# Patient Record
Sex: Male | Born: 1940 | Race: White | Hispanic: No | Smoking: Former smoker
Health system: Southern US, Community
[De-identification: ages and names within clinical notes are randomized; demographics above are authoritative.]

## PROBLEM LIST (undated history)

## (undated) DIAGNOSIS — I639 Cerebral infarction, unspecified: Secondary | ICD-10-CM

## (undated) DIAGNOSIS — I2510 Atherosclerotic heart disease of native coronary artery without angina pectoris: Secondary | ICD-10-CM

## (undated) DIAGNOSIS — G40909 Epilepsy, unspecified, not intractable, without status epilepticus: Secondary | ICD-10-CM

## (undated) DIAGNOSIS — C189 Malignant neoplasm of colon, unspecified: Secondary | ICD-10-CM

## (undated) DIAGNOSIS — K219 Gastro-esophageal reflux disease without esophagitis: Secondary | ICD-10-CM

## (undated) DIAGNOSIS — G8929 Other chronic pain: Secondary | ICD-10-CM

## (undated) HISTORY — DX: Cerebral infarction, unspecified: I63.9

## (undated) HISTORY — PX: COLON SURGERY: SHX602

## (undated) HISTORY — PX: CORONARY ARTERY BYPASS GRAFT: SHX141

---

## 2000-06-15 DIAGNOSIS — C189 Malignant neoplasm of colon, unspecified: Secondary | ICD-10-CM

## 2000-06-15 HISTORY — DX: Malignant neoplasm of colon, unspecified: C18.9

## 2013-08-14 ENCOUNTER — Telehealth: Payer: Self-pay | Admitting: Family Medicine

## 2013-08-14 NOTE — Telephone Encounter (Signed)
Appt given with bill

## 2013-08-21 ENCOUNTER — Ambulatory Visit (INDEPENDENT_AMBULATORY_CARE_PROVIDER_SITE_OTHER): Payer: Medicare Other | Admitting: Family Medicine

## 2013-08-21 ENCOUNTER — Encounter: Payer: Self-pay | Admitting: Family Medicine

## 2013-08-21 ENCOUNTER — Encounter (INDEPENDENT_AMBULATORY_CARE_PROVIDER_SITE_OTHER): Payer: Self-pay

## 2013-08-21 VITALS — BP 134/74 | HR 100 | Temp 98.2°F | Ht 72.0 in | Wt 165.2 lb

## 2013-08-21 DIAGNOSIS — D689 Coagulation defect, unspecified: Secondary | ICD-10-CM

## 2013-08-21 DIAGNOSIS — Z9889 Other specified postprocedural states: Secondary | ICD-10-CM

## 2013-08-21 DIAGNOSIS — R634 Abnormal weight loss: Secondary | ICD-10-CM

## 2013-08-21 DIAGNOSIS — M5136 Other intervertebral disc degeneration, lumbar region: Secondary | ICD-10-CM | POA: Insufficient documentation

## 2013-08-21 DIAGNOSIS — N390 Urinary tract infection, site not specified: Secondary | ICD-10-CM | POA: Insufficient documentation

## 2013-08-21 DIAGNOSIS — C679 Malignant neoplasm of bladder, unspecified: Secondary | ICD-10-CM | POA: Insufficient documentation

## 2013-08-21 DIAGNOSIS — I639 Cerebral infarction, unspecified: Secondary | ICD-10-CM | POA: Insufficient documentation

## 2013-08-21 DIAGNOSIS — T45515A Adverse effect of anticoagulants, initial encounter: Secondary | ICD-10-CM

## 2013-08-21 DIAGNOSIS — I25810 Atherosclerosis of coronary artery bypass graft(s) without angina pectoris: Secondary | ICD-10-CM | POA: Insufficient documentation

## 2013-08-21 DIAGNOSIS — R58 Hemorrhage, not elsewhere classified: Secondary | ICD-10-CM | POA: Insufficient documentation

## 2013-08-21 DIAGNOSIS — Z433 Encounter for attention to colostomy: Secondary | ICD-10-CM | POA: Insufficient documentation

## 2013-08-21 DIAGNOSIS — C189 Malignant neoplasm of colon, unspecified: Secondary | ICD-10-CM | POA: Insufficient documentation

## 2013-08-21 DIAGNOSIS — G894 Chronic pain syndrome: Secondary | ICD-10-CM | POA: Insufficient documentation

## 2013-08-21 DIAGNOSIS — K219 Gastro-esophageal reflux disease without esophagitis: Secondary | ICD-10-CM | POA: Insufficient documentation

## 2013-08-21 DIAGNOSIS — I6350 Cerebral infarction due to unspecified occlusion or stenosis of unspecified cerebral artery: Secondary | ICD-10-CM

## 2013-08-21 DIAGNOSIS — M5137 Other intervertebral disc degeneration, lumbosacral region: Secondary | ICD-10-CM

## 2013-08-21 DIAGNOSIS — G40909 Epilepsy, unspecified, not intractable, without status epilepticus: Secondary | ICD-10-CM | POA: Insufficient documentation

## 2013-08-21 DIAGNOSIS — E559 Vitamin D deficiency, unspecified: Secondary | ICD-10-CM

## 2013-08-21 DIAGNOSIS — I10 Essential (primary) hypertension: Secondary | ICD-10-CM | POA: Insufficient documentation

## 2013-08-21 LAB — POCT UA - MICROSCOPIC ONLY
Casts, Ur, LPF, POC: NEGATIVE
Crystals, Ur, HPF, POC: NEGATIVE
Yeast, UA: NEGATIVE

## 2013-08-21 LAB — POCT CBC
Granulocyte percent: 78.4 %G (ref 37–80)
HCT, POC: 37 % — AB (ref 43.5–53.7)
Hemoglobin: 12.2 g/dL — AB (ref 14.1–18.1)
Lymph, poc: 1.3 (ref 0.6–3.4)
MCH, POC: 28.7 pg (ref 27–31.2)
MCHC: 33 g/dL (ref 31.8–35.4)
MCV: 86.7 fL (ref 80–97)
MPV: 9.7 fL (ref 0–99.8)
POC Granulocyte: 6.4 (ref 2–6.9)
POC LYMPH PERCENT: 15.5 %L (ref 10–50)
Platelet Count, POC: 343 10*3/uL (ref 142–424)
RBC: 4.3 M/uL — AB (ref 4.69–6.13)
RDW, POC: 16.1 %
WBC: 8.2 10*3/uL (ref 4.6–10.2)

## 2013-08-21 LAB — POCT URINALYSIS DIPSTICK
Bilirubin, UA: NEGATIVE
Glucose, UA: NEGATIVE
Ketones, UA: NEGATIVE
Nitrite, UA: NEGATIVE
Spec Grav, UA: 1.02
Urobilinogen, UA: NEGATIVE
pH, UA: 6.5

## 2013-08-21 LAB — POCT INR: INR: 2

## 2013-08-21 MED ORDER — LEVETIRACETAM 500 MG PO TABS: 500.0000 mg | ORAL_TABLET | Freq: Two times a day (BID) | ORAL | Status: DC

## 2013-08-21 MED ORDER — PRAVASTATIN SODIUM 40 MG PO TABS: 40.0000 mg | ORAL_TABLET | Freq: Every day | ORAL | Status: AC

## 2013-08-21 MED ORDER — SERTRALINE HCL 50 MG PO TABS: 50.0000 mg | ORAL_TABLET | Freq: Every day | ORAL | Status: DC

## 2013-08-21 MED ORDER — CARVEDILOL 3.125 MG PO TABS: 3.1250 mg | ORAL_TABLET | Freq: Two times a day (BID) | ORAL | Status: DC

## 2013-08-21 MED ORDER — NITROFURANTOIN MONOHYD MACRO 100 MG PO CAPS: 100.0000 mg | ORAL_CAPSULE | Freq: Two times a day (BID) | ORAL | Status: DC

## 2013-08-21 MED ORDER — DILTIAZEM HCL ER BEADS 300 MG PO CP24: 300.0000 mg | ORAL_CAPSULE | Freq: Every day | ORAL | Status: DC

## 2013-08-21 MED ORDER — MEGESTROL ACETATE 400 MG/10ML PO SUSP: 800.0000 mg | Freq: Every day | ORAL | Status: DC

## 2013-08-21 MED ORDER — PANTOPRAZOLE SODIUM 40 MG PO TBEC: 40.0000 mg | DELAYED_RELEASE_TABLET | Freq: Two times a day (BID) | ORAL | Status: AC

## 2013-08-21 MED ORDER — LISINOPRIL 2.5 MG PO TABS: 2.5000 mg | ORAL_TABLET | Freq: Every day | ORAL | Status: DC

## 2013-08-21 MED ORDER — OXYCODONE HCL ER 30 MG PO T12A: 1.0000 | EXTENDED_RELEASE_TABLET | Freq: Two times a day (BID) | ORAL | Status: DC

## 2013-08-21 MED ORDER — WARFARIN SODIUM 2 MG PO TABS: 2.0000 mg | ORAL_TABLET | Freq: Every day | ORAL | Status: DC

## 2013-08-21 NOTE — Progress Notes (Signed)
Subjective:    Patient ID: [name] [name], male    DOB: 1940-07-10, 73 y.o.   MRN: [id number]  HPI This 73 y.o. male presents for evaluation of establishment visit.  He has hx of CVA and he has Been on warfarin.  His INR was 10.6 and he was hospitalized at [facility].  His daughter in [place] is present and gives hx.  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  He was seen In [place] for CVA this year and then was transferred to [place] rehab in [facility] And was [facility] 07/28/13 and has been staying with his son and daughter in law.  He has hx of colon Cancer 2001 and had bowel resectiolostomy.  In 2002 he was found to have metastatic colon cancer involving the bladder and had another resection of the colon along cystectomy and placement of urostomy tubes and colostomy.  This was in [place].  He was living at home with his wife and he seperated and now is living with his sone and daughter in law.  He is feeling sick and he is losing weight.  He had sz's when he had his CVA 06/24/13.    Review of Systems No chest pain, SOB, HA, dizziness, vision change, N/V, diarrhea, constipation, dysuria, urinary urgency or frequency, myalgias, arthralgias or rash.     Objective:   Physical Exam Vital signs noted  Well developed well nourished male.  HEENT - Head atraumatic Normocephalic                Eyes - PERRLA, Conjuctiva - clear Sclera- Clear EOMI                Ears - EAC's Wnl TM's Wnl Gross Hearing WNL                Nose - Nares patent                 Throat - oropharanx wnl Respiratory - Lungs CTA bilateral Cardiac - RRR S1 and S2 without murmur GI - Abdomen soft Nontender and bowel sounds active x 4 Extremities - No edema. Neuro - Grossly intact.  Results for orders placed in visit on 08/21/13  POCT CBC      Result Value Ref Range   WBC 8.2  4.6 - 10.2 K/uL   Lymph, poc 1.3  0.6 - 3.4   POC LYMPH PERCENT 15.5  10 - 50 %L   POC Granulocyte 6.4  2 - 6.9   Granulocyte percent 78.4  37 - 80 %G   RBC 4.3 (*) 4.69 - 6.13 M/uL   Hemoglobin 12.2 (*) 14.1 - 18.1 g/dL   HCT, POC 37.0 (*) 43.5 - 53.7 %   MCV 86.7  80 - 97 fL   MCH, POC 28.7  27 - 31.2 pg   MCHC 33.0  31.8 - 35.4 g/dL   RDW, POC 16.1     Platelet Count, POC 343.0  142 - 424 K/uL   MPV 9.7  0 - 99.8 fL  POCT INR      Result Value Ref Range   INR 2.0    POCT URINALYSIS DIPSTICK      Result Value Ref Range   Color, UA GOLD     Clarity, UA CLOUDY     Glucose, UA NEG     Bilirubin, UA NEG     Ketones, UA NEG     Spec Grav, UA 1.020  Blood, UA LARGE     pH, UA 6.5     Protein, UA 3+     Urobilinogen, UA negative     Nitrite, UA NEG     Leukocytes, UA large (3+)    POCT UA - MICROSCOPIC ONLY      Result Value Ref Range   WBC, Ur, HPF, POC TNTC     RBC, urine, microscopic TNTC     Bacteria, U Microscopic MOD     Mucus, UA FEW     Epithelial cells, urine per micros FEW     Crystals, Ur, HPF, POC NEG     Casts, Ur, LPF, POC NEG     Yeast, UA NEG         Assessment & Plan:  DDD (degenerative disc disease), lumbar - Plan: Ambulatory referral to Pain Clinic  Essential hypertension, benign - Controlled and continue current antihypertensive meds  Colon cancer - Plan: Ambulatory referral to [place], Ambulatory referral to Pain Clinic  GERD (gastroesophageal reflux disease) - Plan: Continue protonix  Bladder cancer - Plan: Ambulatory referral to Oncology, Ambulatory referral to [place], Ambulatory referral to Pain Clinic  CVA (cerebral vascular accident) - Plan: POCT INR, Ambulatory referral to Neurology, Ambulatory referral to [place], Ambulatory referral to Urology  Bleeding on Coumadin - Plan: POCT CBC, CMP14+EGFR, Ambulatory referral to Neurology, Ambulatory referral to Home Health  Seizure disorder - Continue Keppra and refer to Neurology  CAD (coronary artery disease) of artery bypass graft  - will need referral to Cards in future.  Chronic pain  syndrome - Oxycodone ER 12 hours one po q 12 hours #60 and will refer to pain managemetn.  UTI (lower urinary tract infection) - Plan: POCT urinalysis dipstick, POCT UA - Microscopic Only UA cx ordered and macrobid 154m one po bid x 2 weeks #28  Colostomy care - Home Health Referral  History of urostomy - Plan: Ambulatory referral to Urology  Unspecified vitamin D deficiency - Plan: Vit D  25 hydroxy (rtn osteoporosis monitoring)  Spent over 60 minutes in visit with patient.  Follow up in 1 month  WLysbeth PennerFNP

## 2013-08-21 NOTE — Patient Instructions (Signed)
Anticoagulation Dose Instructions as of 08/21/2013     [name] [name] Tue Wed Thu Fri Sat   New Dose 2 mg 2 mg 2 mg 2 mg 2 mg 2 mg 2 mg    Description       Continue current and follow up in one month for repeat INR

## 2013-08-22 ENCOUNTER — Other Ambulatory Visit: Payer: Self-pay | Admitting: Family Medicine

## 2013-08-22 ENCOUNTER — Encounter: Payer: Self-pay | Admitting: Neurology

## 2013-08-22 ENCOUNTER — Telehealth: Payer: Self-pay | Admitting: Family Medicine

## 2013-08-22 ENCOUNTER — Telehealth: Payer: Self-pay | Admitting: *Deleted

## 2013-08-22 ENCOUNTER — Ambulatory Visit (INDEPENDENT_AMBULATORY_CARE_PROVIDER_SITE_OTHER): Payer: Medicare Other | Admitting: Neurology

## 2013-08-22 VITALS — BP 140/88 | HR 80 | Temp 98.0°F | Resp 18 | Ht 72.0 in | Wt 167.8 lb

## 2013-08-22 DIAGNOSIS — I6350 Cerebral infarction due to unspecified occlusion or stenosis of unspecified cerebral artery: Secondary | ICD-10-CM

## 2013-08-22 DIAGNOSIS — R569 Unspecified convulsions: Secondary | ICD-10-CM

## 2013-08-22 DIAGNOSIS — I639 Cerebral infarction, unspecified: Secondary | ICD-10-CM

## 2013-08-22 DIAGNOSIS — E875 Hyperkalemia: Secondary | ICD-10-CM

## 2013-08-22 LAB — CMP14+EGFR
ALT: 11 IU/L (ref 0–44)
AST: 22 IU/L (ref 0–40)
Albumin/Globulin Ratio: 1.3 (ref 1.1–2.5)
Albumin: 4.1 g/dL (ref 3.5–4.8)
Alkaline Phosphatase: 62 IU/L (ref 39–117)
BUN/Creatinine Ratio: 9 — ABNORMAL LOW (ref 10–22)
BUN: 22 mg/dL (ref 8–27)
CO2: 23 mmol/L (ref 18–29)
Calcium: 9.6 mg/dL (ref 8.6–10.2)
Chloride: 99 mmol/L (ref 97–108)
Creatinine, Ser: 2.34 mg/dL — ABNORMAL HIGH (ref 0.76–1.27)
GFR calc Af Amer: 31 mL/min/{1.73_m2} — ABNORMAL LOW (ref >59)
GFR calc non Af Amer: 27 mL/min/{1.73_m2} — ABNORMAL LOW (ref >59)
Globulin, Total: 3.1 g/dL (ref 1.5–4.5)
Glucose: 97 mg/dL (ref 65–99)
Potassium: 6 mmol/L (ref 3.5–5.2)
Sodium: 139 mmol/L (ref 134–144)
Total Bilirubin: 0.3 mg/dL (ref 0.0–1.2)
Total Protein: 7.2 g/dL (ref 6.0–8.5)

## 2013-08-22 LAB — VITAMIN D 25 HYDROXY (VIT D DEFICIENCY, FRACTURES): Vit D, 25-Hydroxy: 13.2 ng/mL — ABNORMAL LOW (ref 30.0–100.0)

## 2013-08-22 LAB — VITAMIN B12: Vitamin B-12: 284 pg/mL (ref 211–946)

## 2013-08-22 MED ORDER — CYANOCOBALAMIN 1000 MCG/ML IJ SOLN: INTRAMUSCULAR | Status: DC

## 2013-08-22 MED ORDER — VITAMIN D (ERGOCALCIFEROL) 1.25 MG (50000 UNIT) PO CAPS: 50000.0000 [IU] | ORAL_CAPSULE | ORAL | Status: DC

## 2013-08-22 NOTE — Telephone Encounter (Signed)
agree

## 2013-08-22 NOTE — Patient Instructions (Signed)
1.  Continue the Coumadin and Keppra for now. 2.  We will need to get the notes from [place] from both stroke admissions to see why [name] [name] is on coumadin and what tests were done. 3.  No driving. 4.  Follow up in 3 months.

## 2013-08-22 NOTE — Progress Notes (Addendum)
NEUROLOGY CONSULTATION NOTE  [name] [name] MRN: [id number] DOB: March 19, 1941  Referring provider: Stevan Born, FNP Primary care provider: Stevan Born, FNP  Reason for consult:  CVA  HISTORY OF PRESENT ILLNESS: [name] [name] is a 73 year old right-handed man with history of CVA, hypertension, hyperlipidemia, CAD status post bypass graft, bladder cancer, colon cancer who presents for evaluation of CVA.  He is accompanied by his daughter-in-law.  Records and images were personally reviewed where available.    He reportedly had a stroke in January 2014, which left him with residual left-sided weakness.  At that time, he was started on warfarin, but the patient and his daughter in law do not know why.  On 06/25/13, he had a stroke and was admitted to [place] [facility].  He reportedly had a seizure as well, and was placed on Keppra.  Semiology of the seizure and stroke is unknown to patient and daughter-in-law, as well as location of stroke.  He reportedly had a workup and was discharged to rehab until 07/28/13.  He was readmitted to the hospital on 08/13/13 for elevated INR and dehydration.  There was blood in the colostomy and urostomy bag.  His INR was reportedly 10.6.  His warfarin was held and restarted from 4mg  to 2mg .  He reports residual blurred vision.  He has not had any further seizures.  He has been feeling depressed because of his recent health problems.  Also, he is recently separated from his wife and moved in with his son and daughter-in-law.  Unfortunately, both the patient and daughter-in-law are poor historians and I do not have the records from [facility] [employer].  Past medical history is significant for colon cancer diagnosed in 2001 with subsequent bowel resectiolostomy.  In 2002, he underwent another colon resection and cystectomy placement of urostomy tubes and colostomy after it was discovered he had metastatic colon cancer involving the bladder.  INR checked 08/21/13  was 2.0.  PAST MEDICAL HISTORY: Past Medical History  Diagnosis Date  . Cancer 2002    colon  . Stroke     PAST SURGICAL HISTORY: Past Surgical History  Procedure Laterality Date  . Colon surgery    . Coronary artery bypass graft      MEDICATIONS: Current Outpatient Prescriptions on File Prior to Visit  Medication Sig Dispense Refill  . carvedilol (COREG) 3.125 MG tablet Take 1 tablet (3.125 mg total) by mouth 2 (two) times daily with a meal.  60 tablet  11  . cholecalciferol (VITAMIN D) 1000 UNITS tablet Take 1,000 Units by mouth daily.      Marland Kitchen diltiazem (TIAZAC) 300 MG 24 hr capsule Take 1 capsule (300 mg total) by mouth daily.  30 capsule  11  . levETIRAcetam (KEPPRA) 500 MG tablet Take 1 tablet (500 mg total) by mouth 2 (two) times daily.  60 tablet  11  . lisinopril (PRINIVIL,ZESTRIL) 2.5 MG tablet Take 1 tablet (2.5 mg total) by mouth daily.  30 tablet  11  . megestrol (MEGACE) 400 MG/10ML suspension Take 20 mLs (800 mg total) by mouth daily.  240 mL  3  . Multiple Vitamins-Iron (MULTIVITAMINS WITH IRON) TABS tablet Take 1 tablet by mouth daily.      . nitrofurantoin, macrocrystal-monohydrate, (MACROBID) 100 MG capsule Take 1 capsule (100 mg total) by mouth 2 (two) times daily.  28 capsule  0  . OxyCODONE HCl ER (OXYCONTIN) 60 MG T12A Take 1 tablet by mouth 2 (two) times daily.      Marland Kitchen  OxyCODONE HCl ER 30 MG T12A Take 1 tablet by mouth 2 (two) times daily.  60 each  0  . pantoprazole (PROTONIX) 40 MG tablet Take 1 tablet (40 mg total) by mouth 2 (two) times daily.  60 tablet  11  . pravastatin (PRAVACHOL) 40 MG tablet Take 1 tablet (40 mg total) by mouth daily.  30 tablet  11  . sertraline (ZOLOFT) 50 MG tablet Take 1 tablet (50 mg total) by mouth daily.  30 tablet  11  . warfarin (COUMADIN) 2 MG tablet Take 1 tablet (2 mg total) by mouth daily.  30 tablet  11   No current facility-administered medications on file prior to visit.    ALLERGIES: No Known Allergies  FAMILY  HISTORY: Family History  Problem Relation Age of Onset  . Heart disease Mother   . COPD Father     SOCIAL HISTORY: History   Social History  . Marital Status: Legally Separated    Spouse Name: N/A    Number of Children: N/A  . Years of Education: N/A   Occupational History  . Not on file.   Social History Main Topics  . Smoking status: Former Smoker -- 1.00 packs/day    Types: Cigarettes    Start date: 12/26/1958    Quit date: 12/26/1995  . Smokeless tobacco: Not on file  . Alcohol Use: No     Comment: rare  . Drug Use: No  . Sexual Activity: Not on file   Other Topics Concern  . Not on file   Social History Narrative  . No narrative on file    REVIEW OF SYSTEMS: Constitutional: No fevers, chills, or sweats, no generalized fatigue, change in appetite Eyes: No visual changes, double vision, eye pain Ear, nose and throat: No hearing loss, ear pain, nasal congestion, sore throat Cardiovascular: No chest pain, palpitations Respiratory:  No shortness of breath at rest or with exertion, wheezes GastrointestinaI: No nausea, vomiting, diarrhea, abdominal pain, fecal incontinence Genitourinary:  No dysuria, urinary retention or frequency Musculoskeletal:  No neck pain, back pain Integumentary: No rash, pruritus, skin lesions Neurological: as above Psychiatric: No depression, insomnia, anxiety Endocrine: No palpitations, fatigue, diaphoresis, mood swings, change in appetite, change in weight, increased thirst Hematologic/Lymphatic:  No anemia, purpura, petechiae. Allergic/Immunologic: no itchy/runny eyes, nasal congestion, recent allergic reactions, rashes  PHYSICAL EXAM: Filed Vitals:   08/22/13 1018  BP: 140/88  Pulse: 80  Temp: 98 F (36.7 C)  Resp: 18   General: No acute distress Head:  Normocephalic/atraumatic Neck: supple, no paraspinal tenderness, full range of motion Back: No paraspinal tenderness Heart: regular rate and rhythm Lungs: Clear to  auscultation bilaterally. Vascular: No carotid bruits. Neurological Exam: Mental status: alert and oriented to person, place, and time, speech fluent and not dysarthric, language intact. Cranial nerves: CN I: not tested CN II: pupils equal, round and reactive to light, visual fields intact, fundi unremarkable. CN III, IV, VI:  full range of motion, no nystagmus, no ptosis CN V: facial sensation intact CN VII: mild left lower facial weakness. CN VIII: hearing intact CN IX, X: gag intact, uvula midline CN XI: sternocleidomastoid and trapezius muscles intact CN XII: tongue midline Bulk & Tone: normal, no fasciculations. Motor: 5/5 throughout, slowed finger thumb tapping on the left.  Mild drift on the left. Sensation: endorses mildly reduced pinprick sensation in left hand and foot.  Vibration intact. Deep Tendon Reflexes: 3+ left biceps, triceps, brachioradialis and patellar, otherwise 2+, toes down Finger to nose  testing: no dysmetria Heel to shin: no dysmetria Gait: normal stance and stride.  Reduced right arm swing.  Able to turn, walk on toes, heels and in tandem. Romberg negative.  IMPRESSION: CVA Post-stroke seizures  Unfortunately, I do not know details.  PLAN: Need to obtain records from [place] to determine reason for warfarin, as well as testing recently performed for secondary stroke prevention. Further testing pending review of these notes. Continue Keppra 500mg  twice daily for now. No driving. Follow up in 3 months.    Thank you for allowing me to take part in the care of this patient.  Metta Clines, DO  CC:  Stevan Born, FNP  Redge Gainer, MD   ADDENDUM: Received and reviewed the notes from [place].  He was admitted to [facility] in January 2015 for generalized involuntary movements, associated with tongue biting.  No frothing of the mouth, bowel or bladder incontinence or reportedly loss of consciousness (although he is amnestic to the  event).  He was also confused.  In the ED, CT of the head showed old right MCA infarct but nothing acute.  There is no mention of an MRI of the brain performed, nor a stroke workup.  He was initially started on lacosaminde and then carbamazepine.  He had confusion and was then transitioned to only levetiracetam.  During hospitalization, he had further medical decline.  He had cardiomyopathy, thought to be due to benign ischemia due to the seizure and not NSTEMI.  He developed some acute kidney insufficiency, treated with IV hydration.  He then developed hematemesis.  EGD revealed a Mallory-Weiss tear.  Coumadin was held.  He was started on a PPI and Coumadin was subsequently restarted.  During this admission, his INR was in the 1.60s to 2.90s.  It appears that he is on Coumadin for paroxysmal atrial fibrillation in the setting of history of stroke.  With this in mind, anticoagulation would typically be most appropriate therapy for secondary stroke prevention in setting of stroke with paroxysmal atrial fibrillation.  His initial bleeding from the Mallory-Weiss tear was treated with PPI.  The subsequent bleeding after admission appears to be secondary to supratherapeutic INR, which was subsequently treated and Coumadin dose adjusted.  Of course, one must weigh the risks and benefits.  To me, it appears that he has been treated for GI bleed.  If there is a question, then evaluation by GI may be helpful.  If there is a question about the Coumadin, which is reportedly for paroxysmal atrial fibrillation, he should be evaluated by cardiology as well.    Also, since this most recent hospitalization was for seizure in the setting of a patient with previous stroke, and not acute stroke, no further stroke workup warranted at this time, as he is already on anticoagulation for secondary stroke prevention.  Draven Natter R. Tomi Likens, DO

## 2013-08-22 NOTE — Telephone Encounter (Signed)
[facility] called with critical value. Potassium 6.0 Spoke with patient. He will need to repeat this lab. He can't come in today but will come tomorrow. Denies chest pain or palpitations. Asked him to call 911 if these develop. Patient stated understanding and agreement to plan.

## 2013-08-23 ENCOUNTER — Other Ambulatory Visit (INDEPENDENT_AMBULATORY_CARE_PROVIDER_SITE_OTHER): Payer: Medicare Other

## 2013-08-23 DIAGNOSIS — E875 Hyperkalemia: Secondary | ICD-10-CM

## 2013-08-23 LAB — URINE CULTURE

## 2013-08-23 NOTE — Telephone Encounter (Signed)
Spoke with patient yesterday about elevated potassium level and need to repeat this today. This is most likely due to renal failure but per protocol need to make sure levels aren't elevating.  Left detailed message on home phone with results and recommendation to return in 1 month for additional f/u and sooner if necessary. Patient was prescribed antibiotic or UTI at visit and he needs to complete those.

## 2013-08-24 ENCOUNTER — Other Ambulatory Visit: Payer: Self-pay | Admitting: Family Medicine

## 2013-08-24 LAB — BMP8+EGFR
BUN/Creatinine Ratio: 8 — ABNORMAL LOW (ref 10–22)
BUN: 15 mg/dL (ref 8–27)
CO2: 26 mmol/L (ref 18–29)
Calcium: 9.6 mg/dL (ref 8.6–10.2)
Chloride: 100 mmol/L (ref 97–108)
Creatinine, Ser: 1.88 mg/dL — ABNORMAL HIGH (ref 0.76–1.27)
GFR calc Af Amer: 41 mL/min/{1.73_m2} — ABNORMAL LOW (ref >59)
GFR calc non Af Amer: 35 mL/min/{1.73_m2} — ABNORMAL LOW (ref >59)
Glucose: 100 mg/dL — ABNORMAL HIGH (ref 65–99)
Potassium: 6 mmol/L (ref 3.5–5.2)
Sodium: 137 mmol/L (ref 134–144)

## 2013-09-04 ENCOUNTER — Telehealth: Payer: Self-pay | Admitting: Family Medicine

## 2013-09-04 NOTE — Telephone Encounter (Signed)
Appt given for tomorrow per patients request 

## 2013-09-05 ENCOUNTER — Encounter: Payer: Self-pay | Admitting: Family Medicine

## 2013-09-05 ENCOUNTER — Ambulatory Visit (INDEPENDENT_AMBULATORY_CARE_PROVIDER_SITE_OTHER): Payer: Medicare Other | Admitting: Family Medicine

## 2013-09-05 VITALS — BP 88/42 | HR 55 | Temp 97.2°F | Ht 72.0 in | Wt 160.6 lb

## 2013-09-05 DIAGNOSIS — I2510 Atherosclerotic heart disease of native coronary artery without angina pectoris: Secondary | ICD-10-CM

## 2013-09-05 DIAGNOSIS — G894 Chronic pain syndrome: Secondary | ICD-10-CM

## 2013-09-05 DIAGNOSIS — IMO0002 Reserved for concepts with insufficient information to code with codable children: Secondary | ICD-10-CM

## 2013-09-05 DIAGNOSIS — A0472 Enterocolitis due to Clostridium difficile, not specified as recurrent: Secondary | ICD-10-CM

## 2013-09-05 LAB — POCT CBC
Granulocyte percent: 75.9 %G (ref 37–80)
HCT, POC: 32.3 % — AB (ref 43.5–53.7)
Hemoglobin: 10.2 g/dL — AB (ref 14.1–18.1)
Lymph, poc: 1.7 (ref 0.6–3.4)
MCH, POC: 27.5 pg (ref 27–31.2)
MCHC: 31.7 g/dL — AB (ref 31.8–35.4)
MCV: 86.8 fL (ref 80–97)
MPV: 9.5 fL (ref 0–99.8)
POC Granulocyte: 7.3 — AB (ref 2–6.9)
POC LYMPH PERCENT: 17.5 %L (ref 10–50)
Platelet Count, POC: 296 10*3/uL (ref 142–424)
RBC: 3.7 M/uL — AB (ref 4.69–6.13)
RDW, POC: 17.5 %
WBC: 9.6 10*3/uL (ref 4.6–10.2)

## 2013-09-05 MED ORDER — OXYCODONE HCL 5 MG PO CAPS: ORAL_CAPSULE | ORAL | Status: DC

## 2013-09-05 NOTE — Progress Notes (Signed)
   Subjective:    Patient ID: [name] [name], male    DOB: [birth year], 73 y.o.   MRN: [id number]  HPI This 73 y.o. male presents for evaluation of hospital follow up.  He was having some weakness and fatigue thought due UTI and acute renal failure on top of chronic renal insufficiency.  He had AMI and  And c-diff colitis and he is taking low dose vancomyin for this.  He needs more pain medicine because Of DDD and he is having more breakthrough pain.       Review of Systems C/o chronic pain No chest pain, SOB, HA, dizziness, vision change, N/V, diarrhea, constipation, dysuria, urinary urgency or frequency, myalgias, arthralgias or rash.     Objective:   Physical Exam   Chronically ill appearing male in NAD.  HEENT - Head atraumatic Normocephalic                Eyes - PERRLA, Conjuctiva - clear Sclera- Clear EOMI                Ears - EAC's Wnl TM's Wnl Gross Hearing WNL                Throat - oropharanx wnl Respiratory - Lungs CTA bilateral Cardiac - RRR S1 and S2 without murmur GI - Abdomen soft Nontender and bowel sounds active x 4 Extremities - No edema. Neuro - Grossly intact.      Assessment & Plan:  Chronic pain syndrome - Plan: oxycodone (OXY-IR) 5 MG capsule, POCT CBC, CMP14+EGFR,  Follow up with Cherre Robins Pharm D for help with pain meds and pain management.  Colitis, Clostridium difficile - Plan: POCT CBC, CMP14+EGFR.  Continue Vanco rx and follow up In next 2 weeks  CAD (coronary artery disease) - Plan: POCT CBC, CMP14+EGFR.  Follow up with cardiology appointment.  DDD (degenerative disc disease) - Plan: oxycodone (OXY-IR) 5 MG capsule, CMP14+EGFR  Lysbeth Penner FNP

## 2013-09-06 LAB — CMP14+EGFR
ALT: 9 IU/L (ref 0–44)
AST: 11 IU/L (ref 0–40)
Albumin/Globulin Ratio: 1.3 (ref 1.1–2.5)
Albumin: 3.4 g/dL — ABNORMAL LOW (ref 3.5–4.8)
Alkaline Phosphatase: 57 IU/L (ref 39–117)
BUN/Creatinine Ratio: 12 (ref 10–22)
BUN: 16 mg/dL (ref 8–27)
CO2: 23 mmol/L (ref 18–29)
Calcium: 8.4 mg/dL — ABNORMAL LOW (ref 8.6–10.2)
Chloride: 103 mmol/L (ref 97–108)
Creatinine, Ser: 1.33 mg/dL — ABNORMAL HIGH (ref 0.76–1.27)
GFR calc Af Amer: 62 mL/min/{1.73_m2} (ref >59)
GFR calc non Af Amer: 53 mL/min/{1.73_m2} — ABNORMAL LOW (ref >59)
Globulin, Total: 2.7 g/dL (ref 1.5–4.5)
Glucose: 116 mg/dL — ABNORMAL HIGH (ref 65–99)
Potassium: 4.3 mmol/L (ref 3.5–5.2)
Sodium: 141 mmol/L (ref 134–144)
Total Bilirubin: 0.3 mg/dL (ref 0.0–1.2)
Total Protein: 6.1 g/dL (ref 6.0–8.5)

## 2013-09-18 ENCOUNTER — Ambulatory Visit (INDEPENDENT_AMBULATORY_CARE_PROVIDER_SITE_OTHER): Payer: Medicare Other | Admitting: Family Medicine

## 2013-09-18 ENCOUNTER — Encounter: Payer: Self-pay | Admitting: Family Medicine

## 2013-09-18 VITALS — BP 92/57 | HR 55 | Temp 97.1°F | Ht 72.0 in | Wt 164.6 lb

## 2013-09-18 DIAGNOSIS — G894 Chronic pain syndrome: Secondary | ICD-10-CM

## 2013-09-18 DIAGNOSIS — E46 Unspecified protein-calorie malnutrition: Secondary | ICD-10-CM

## 2013-09-18 MED ORDER — OXYCODONE HCL ER 30 MG PO T12A: 30.0000 mg | EXTENDED_RELEASE_TABLET | Freq: Two times a day (BID) | ORAL | Status: DC

## 2013-09-18 MED ORDER — ENSURE PLUS PO LIQD: 237.0000 mL | Freq: Three times a day (TID) | ORAL | Status: DC

## 2013-09-18 MED ORDER — ENSURE PLUS PO LIQD: 237.0000 mL | Freq: Three times a day (TID) | ORAL | Status: AC

## 2013-09-18 MED ORDER — WARFARIN SODIUM 2 MG PO TABS: 2.0000 mg | ORAL_TABLET | Freq: Every day | ORAL | Status: DC

## 2013-09-18 NOTE — Progress Notes (Signed)
   Subjective:    Patient ID: [name] [name], male    DOB: [birth year], 73 y.o.   MRN: [id number]  HPI  This 73 y.o. male presents for evaluation of routine follow up.  He has hx bladder cancer and colon cancer.  He has been to see his urologist.  He has been taking megace for malnutrition and does not Like the taste.  He has hx of chronic pain and is taking oxycontin er 30mg  bid and oxycodone 5mg  po for breakthrough.  He has been to see his neurologist. He is awaiting a cardiology consult.  Review of Systems    No chest pain, SOB, HA, dizziness, vision change, N/V, diarrhea, constipation, dysuria, urinary urgency or frequency, myalgias, arthralgias or rash.  Objective:   Physical Exam Vital signs noted  Chronically ill appearing male in NAD.  HEENT - Head atraumatic Normocephalic                Eyes - PERRLA, Conjuctiva - clear Sclera- Clear EOMI                Ears - EAC's Wnl TM's Wnl Gross Hearing WNL                Throat - oropharanx wnl Respiratory - Lungs CTA bilateral Cardiac - RRR S1 and S2 without murmur GI - Abdomen soft Nontender and bowel sounds active x 4       Assessment & Plan:  Chronic pain syndrome - Plan: warfarin (COUMADIN) 2 MG tablet, OxyCODONE HCl ER 30 MG T12A, ENSURE PLUS (ENSURE PLUS) LIQD, DISCONTINUED: ENSURE PLUS (ENSURE PLUS) LIQD  Malnourished - Plan: ENSURE PLUS (ENSURE PLUS) LIQD, DISCONTINUED: ENSURE PLUS (ENSURE PLUS) LIQD  Anemia - Take iron otc for next 4 weeks  Follow up in one month  Lysbeth Penner FNP

## 2013-09-25 ENCOUNTER — Ambulatory Visit (INDEPENDENT_AMBULATORY_CARE_PROVIDER_SITE_OTHER): Payer: Medicare Other | Admitting: Pharmacist

## 2013-09-25 VITALS — BP 101/60 | HR 62 | Ht 72.0 in | Wt 165.0 lb

## 2013-09-25 DIAGNOSIS — G894 Chronic pain syndrome: Secondary | ICD-10-CM

## 2013-09-25 DIAGNOSIS — I639 Cerebral infarction, unspecified: Secondary | ICD-10-CM

## 2013-09-25 DIAGNOSIS — I6350 Cerebral infarction due to unspecified occlusion or stenosis of unspecified cerebral artery: Secondary | ICD-10-CM

## 2013-09-25 LAB — POCT INR: INR: 1.1

## 2013-09-25 MED ORDER — DULOXETINE HCL 60 MG PO CPEP: 60.0000 mg | ORAL_CAPSULE | Freq: Every day | ORAL | Status: DC

## 2013-09-25 MED ORDER — OXYCODONE HCL ER 40 MG PO T12A: 40.0000 mg | EXTENDED_RELEASE_TABLET | Freq: Two times a day (BID) | ORAL | Status: DC

## 2013-09-25 NOTE — Progress Notes (Signed)
Subjective:    Joshual Terrio is a 73 y.o. male who presents for evaluation of chronic pain and to have INR rechecked. Records reviewed (though this is limited as patient is fairly new to our practice and [place]) Patient has recently moved from [facility] where he was seen by [place]. Current regimen is Oxycontin 30mg  1 tablet q 12 hours (previous pain management prescribed tid) and oxycodone IR 5mg  prn BTP (somedays takes 1 tablet, some days takes 4) He has seen pain mangment in past and previous therapy includes:  Spinal injections which patient believes may have contributed to stoke shortly after injections.  duragesic - tried in 2003 but made patient out of it for 2 days and then had no pain relief for 3rd day  MSContin - caused diarrhea Patient has history of colon CA which extended into his kidney and bladder.  Bladder was removed.  He has no had a recurrance of CA but continues to experience chronic pain from surgery and radiation.  Nature of the pain: dull, throbbing constant pain Location of the pain: abdomen, lower back and left leg  Intensity: currently - 7/10, worse 8/10 and best 4-5/10  Exacerbating/relieving factors: sitting too long, standing too long Activity - patient 1 year ago was more active but in January had a stroke.  Per daughter in law and patient his activity has increased since January but is less than 1 year ago.  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     Objective:      Filed Vitals:   09/25/13 1220  BP: 101/60  Pulse: 62   Body mass index is 22.37 kg/(m^2).   INR = 1.1 Assessment:    Assessment: Chronic low back pain (LBP): unable to determine origin until get copy of xrays and other radiological tests Depression: and neuropathy - dealing with changes in family situation Osteoarthritis of back.  Subtherapeutic INR   Plan:     increase oxycontin to 40mg  1 tablet q12h Start  Cymbalta 60mg  1 capsule daily - should help with pain and mood.  discontinue sertraline    Anticoagulation Dose Instructions as of 09/25/2013     Dorene Grebe Tue Wed Thu Fri Sat   New Dose 2 mg 4 mg 2 mg 2 mg 2 mg 4 mg 2 mg    Description       Take 2 tablets for next 2 days then increase to 2 tablets on mondays and fridays and 1 tablet all other days.    RTC in 1 week to recheck INR  Cherre Robins, PharmD, CPP

## 2013-09-25 NOTE — Patient Instructions (Signed)
Anticoagulation Dose Instructions as of 09/25/2013     [name] [name] Tue Wed Thu Fri Sat   New Dose 2 mg 4 mg 2 mg 2 mg 2 mg 4 mg 2 mg    Description       Take 2 tablets for next 2 days then increase to 2 tablets on mondays and fridays and 1 tablet all other days.      INR was 1.1 (goal is 2.0 to 3.0)

## 2013-10-03 ENCOUNTER — Encounter: Payer: Self-pay | Admitting: *Deleted

## 2013-10-03 ENCOUNTER — Telehealth: Payer: Self-pay | Admitting: Family Medicine

## 2013-10-03 NOTE — Telephone Encounter (Signed)
Spoke with daughter in law and advised that they take him to Er for evaluation .

## 2013-10-10 ENCOUNTER — Ambulatory Visit (INDEPENDENT_AMBULATORY_CARE_PROVIDER_SITE_OTHER): Payer: Medicare Other | Admitting: Pharmacist Clinician (PhC)/ Clinical Pharmacy Specialist

## 2013-10-10 DIAGNOSIS — I6350 Cerebral infarction due to unspecified occlusion or stenosis of unspecified cerebral artery: Secondary | ICD-10-CM

## 2013-10-10 DIAGNOSIS — I639 Cerebral infarction, unspecified: Secondary | ICD-10-CM

## 2013-10-10 LAB — POCT INR: INR: 1.6

## 2013-10-11 ENCOUNTER — Encounter (HOSPITAL_COMMUNITY): Payer: Self-pay | Admitting: Radiology

## 2013-10-11 ENCOUNTER — Emergency Department (HOSPITAL_COMMUNITY): Payer: Medicare Other

## 2013-10-11 ENCOUNTER — Inpatient Hospital Stay (HOSPITAL_COMMUNITY)
Admission: EM | Admit: 2013-10-11 | Discharge: 2013-10-19 | DRG: 100 | Disposition: A | Discharge status: Hospice | Payer: Medicare Other | Attending: Internal Medicine | Admitting: Internal Medicine

## 2013-10-11 DIAGNOSIS — I10 Essential (primary) hypertension: Secondary | ICD-10-CM

## 2013-10-11 DIAGNOSIS — Z79899 Other long term (current) drug therapy: Secondary | ICD-10-CM

## 2013-10-11 DIAGNOSIS — E440 Moderate protein-calorie malnutrition: Secondary | ICD-10-CM | POA: Insufficient documentation

## 2013-10-11 DIAGNOSIS — M5136 Other intervertebral disc degeneration, lumbar region: Secondary | ICD-10-CM

## 2013-10-11 DIAGNOSIS — IMO0002 Reserved for concepts with insufficient information to code with codable children: Secondary | ICD-10-CM | POA: Diagnosis not present

## 2013-10-11 DIAGNOSIS — E872 Acidosis, unspecified: Secondary | ICD-10-CM

## 2013-10-11 DIAGNOSIS — I252 Old myocardial infarction: Secondary | ICD-10-CM

## 2013-10-11 DIAGNOSIS — I129 Hypertensive chronic kidney disease with stage 1 through stage 4 chronic kidney disease, or unspecified chronic kidney disease: Secondary | ICD-10-CM | POA: Diagnosis present

## 2013-10-11 DIAGNOSIS — G40109 Localization-related (focal) (partial) symptomatic epilepsy and epileptic syndromes with simple partial seizures, not intractable, without status epilepticus: Principal | ICD-10-CM | POA: Diagnosis present

## 2013-10-11 DIAGNOSIS — I2510 Atherosclerotic heart disease of native coronary artery without angina pectoris: Secondary | ICD-10-CM | POA: Diagnosis present

## 2013-10-11 DIAGNOSIS — I5021 Acute systolic (congestive) heart failure: Secondary | ICD-10-CM

## 2013-10-11 DIAGNOSIS — R4182 Altered mental status, unspecified: Secondary | ICD-10-CM

## 2013-10-11 DIAGNOSIS — Z87891 Personal history of nicotine dependence: Secondary | ICD-10-CM

## 2013-10-11 DIAGNOSIS — E876 Hypokalemia: Secondary | ICD-10-CM | POA: Diagnosis not present

## 2013-10-11 DIAGNOSIS — J9620 Acute and chronic respiratory failure, unspecified whether with hypoxia or hypercapnia: Secondary | ICD-10-CM | POA: Diagnosis present

## 2013-10-11 DIAGNOSIS — Z433 Encounter for attention to colostomy: Secondary | ICD-10-CM

## 2013-10-11 DIAGNOSIS — Z9889 Other specified postprocedural states: Secondary | ICD-10-CM

## 2013-10-11 DIAGNOSIS — G894 Chronic pain syndrome: Secondary | ICD-10-CM | POA: Diagnosis present

## 2013-10-11 DIAGNOSIS — Z8673 Personal history of transient ischemic attack (TIA), and cerebral infarction without residual deficits: Secondary | ICD-10-CM

## 2013-10-11 DIAGNOSIS — I509 Heart failure, unspecified: Secondary | ICD-10-CM | POA: Diagnosis present

## 2013-10-11 DIAGNOSIS — C189 Malignant neoplasm of colon, unspecified: Secondary | ICD-10-CM

## 2013-10-11 DIAGNOSIS — Z933 Colostomy status: Secondary | ICD-10-CM

## 2013-10-11 DIAGNOSIS — Z8551 Personal history of malignant neoplasm of bladder: Secondary | ICD-10-CM

## 2013-10-11 DIAGNOSIS — J9600 Acute respiratory failure, unspecified whether with hypoxia or hypercapnia: Secondary | ICD-10-CM

## 2013-10-11 DIAGNOSIS — D509 Iron deficiency anemia, unspecified: Secondary | ICD-10-CM | POA: Diagnosis present

## 2013-10-11 DIAGNOSIS — C679 Malignant neoplasm of bladder, unspecified: Secondary | ICD-10-CM

## 2013-10-11 DIAGNOSIS — I420 Dilated cardiomyopathy: Secondary | ICD-10-CM

## 2013-10-11 DIAGNOSIS — I214 Non-ST elevation (NSTEMI) myocardial infarction: Secondary | ICD-10-CM | POA: Diagnosis not present

## 2013-10-11 DIAGNOSIS — G40909 Epilepsy, unspecified, not intractable, without status epilepticus: Secondary | ICD-10-CM

## 2013-10-11 DIAGNOSIS — I25810 Atherosclerosis of coronary artery bypass graft(s) without angina pectoris: Secondary | ICD-10-CM

## 2013-10-11 DIAGNOSIS — K219 Gastro-esophageal reflux disease without esophagitis: Secondary | ICD-10-CM | POA: Diagnosis present

## 2013-10-11 DIAGNOSIS — I428 Other cardiomyopathies: Secondary | ICD-10-CM | POA: Diagnosis present

## 2013-10-11 DIAGNOSIS — Z951 Presence of aortocoronary bypass graft: Secondary | ICD-10-CM

## 2013-10-11 DIAGNOSIS — R58 Hemorrhage, not elsewhere classified: Secondary | ICD-10-CM

## 2013-10-11 DIAGNOSIS — T45515A Adverse effect of anticoagulants, initial encounter: Secondary | ICD-10-CM

## 2013-10-11 DIAGNOSIS — I959 Hypotension, unspecified: Secondary | ICD-10-CM

## 2013-10-11 DIAGNOSIS — Z66 Do not resuscitate: Secondary | ICD-10-CM | POA: Diagnosis not present

## 2013-10-11 DIAGNOSIS — I639 Cerebral infarction, unspecified: Secondary | ICD-10-CM

## 2013-10-11 DIAGNOSIS — Z8249 Family history of ischemic heart disease and other diseases of the circulatory system: Secondary | ICD-10-CM

## 2013-10-11 DIAGNOSIS — Z936 Other artificial openings of urinary tract status: Secondary | ICD-10-CM

## 2013-10-11 DIAGNOSIS — I2589 Other forms of chronic ischemic heart disease: Secondary | ICD-10-CM | POA: Diagnosis present

## 2013-10-11 DIAGNOSIS — N390 Urinary tract infection, site not specified: Secondary | ICD-10-CM

## 2013-10-11 DIAGNOSIS — N183 Chronic kidney disease, stage 3 unspecified: Secondary | ICD-10-CM | POA: Diagnosis present

## 2013-10-11 DIAGNOSIS — Z7982 Long term (current) use of aspirin: Secondary | ICD-10-CM

## 2013-10-11 DIAGNOSIS — R569 Unspecified convulsions: Secondary | ICD-10-CM

## 2013-10-11 DIAGNOSIS — Z836 Family history of other diseases of the respiratory system: Secondary | ICD-10-CM

## 2013-10-11 DIAGNOSIS — I4891 Unspecified atrial fibrillation: Secondary | ICD-10-CM

## 2013-10-11 DIAGNOSIS — J811 Chronic pulmonary edema: Secondary | ICD-10-CM

## 2013-10-11 DIAGNOSIS — Z7901 Long term (current) use of anticoagulants: Secondary | ICD-10-CM

## 2013-10-11 DIAGNOSIS — Z85038 Personal history of other malignant neoplasm of large intestine: Secondary | ICD-10-CM

## 2013-10-11 DIAGNOSIS — I5023 Acute on chronic systolic (congestive) heart failure: Secondary | ICD-10-CM | POA: Diagnosis not present

## 2013-10-11 HISTORY — DX: Other chronic pain: G89.29

## 2013-10-11 HISTORY — DX: Gastro-esophageal reflux disease without esophagitis: K21.9

## 2013-10-11 HISTORY — DX: Atherosclerotic heart disease of native coronary artery without angina pectoris: I25.10

## 2013-10-11 HISTORY — DX: Epilepsy, unspecified, not intractable, without status epilepticus: G40.909

## 2013-10-11 HISTORY — DX: Malignant neoplasm of colon, unspecified: C18.9

## 2013-10-11 LAB — COMPREHENSIVE METABOLIC PANEL
ALK PHOS: 52 U/L (ref 39–117)
ALT: 8 U/L (ref 0–53)
AST: 17 U/L (ref 0–37)
Albumin: 3.7 g/dL (ref 3.5–5.2)
BUN: 20 mg/dL (ref 6–23)
CO2: 19 mEq/L (ref 19–32)
CREATININE: 1.6 mg/dL — AB (ref 0.50–1.35)
Calcium: 9.3 mg/dL (ref 8.4–10.5)
Chloride: 106 mEq/L (ref 96–112)
GFR calc Af Amer: 48 mL/min — ABNORMAL LOW (ref >90)
GFR calc non Af Amer: 42 mL/min — ABNORMAL LOW (ref >90)
GLUCOSE: 127 mg/dL — AB (ref 70–99)
POTASSIUM: 4.2 meq/L (ref 3.7–5.3)
Sodium: 140 mEq/L (ref 137–147)
TOTAL PROTEIN: 8.1 g/dL (ref 6.0–8.3)
Total Bilirubin: 0.4 mg/dL (ref 0.3–1.2)

## 2013-10-11 LAB — CBC
HCT: 35.5 % — ABNORMAL LOW (ref 39.0–52.0)
Hemoglobin: 12 g/dL — ABNORMAL LOW (ref 13.0–17.0)
MCH: 29.3 pg (ref 26.0–34.0)
MCHC: 33.8 g/dL (ref 30.0–36.0)
MCV: 86.6 fL (ref 78.0–100.0)
Platelets: 321 10*3/uL (ref 150–400)
RBC: 4.1 MIL/uL — ABNORMAL LOW (ref 4.22–5.81)
RDW: 17.4 % — ABNORMAL HIGH (ref 11.5–15.5)
WBC: 11.7 10*3/uL — ABNORMAL HIGH (ref 4.0–10.5)

## 2013-10-11 LAB — ETHANOL

## 2013-10-11 LAB — CBG MONITORING, ED
GLUCOSE-CAPILLARY: 111 mg/dL — AB (ref 70–99)
Glucose-Capillary: 128 mg/dL — ABNORMAL HIGH (ref 70–99)

## 2013-10-11 LAB — MAGNESIUM: Magnesium: 1.7 mg/dL (ref 1.5–2.5)

## 2013-10-11 LAB — PHOSPHORUS: Phosphorus: 3.9 mg/dL (ref 2.3–4.6)

## 2013-10-11 LAB — I-STAT TROPONIN, ED: TROPONIN I, POC: 0.01 ng/mL (ref 0.00–0.08)

## 2013-10-11 MED ORDER — SODIUM CHLORIDE 0.9 % IV SOLN
1500.0000 mg | INTRAVENOUS | Status: AC
  Administered 2013-10-11: 1500 mg via INTRAVENOUS
  Filled 2013-10-11: qty 30

## 2013-10-11 MED ORDER — DEXTROSE 50 % IV SOLN: 1.0000 | Freq: Once | INTRAVENOUS | Status: AC | PRN

## 2013-10-11 MED ORDER — LORAZEPAM 2 MG/ML IJ SOLN
2.0000 mg | Freq: Once | INTRAMUSCULAR | Status: AC
  Administered 2013-10-11: 2 mg via INTRAVENOUS

## 2013-10-11 MED ORDER — LORAZEPAM 2 MG/ML IJ SOLN
INTRAMUSCULAR | Status: AC
  Filled 2013-10-11: qty 2

## 2013-10-11 MED ORDER — LORAZEPAM 2 MG/ML IJ SOLN
INTRAMUSCULAR | Status: AC
  Filled 2013-10-11: qty 1

## 2013-10-11 NOTE — ED Notes (Addendum)
Dr. Jodi Mourning ordered another 2mg  of Ativan due to the patient still twitching and prior to doing a CT scan.  Patient did stop seizing.

## 2013-10-11 NOTE — ED Notes (Signed)
Seizure pads applied to bed rails

## 2013-10-11 NOTE — ED Notes (Signed)
According to EMS, the patient had been "twitching" since about 1800hrs.  The patient was originally a GCS-15 and was alert and oriented x4.  They transported the patient here to the [facility] ED because he advised EMS that the last time he was twitching he was having a stroke.  On the way here, EMS said the patient was still twitching but he was still responding.  Upon their arrival to the ED the patient went unresponsive and began to twitch from the clavicle up.  The patient went straight to CT.

## 2013-10-11 NOTE — Consult Note (Signed)
Neurology Consultation Reason for Consult: Seizures Referring Physician: Alyson Locket.   CC: Seizures  History is obtained from: Medical record, EMS  HPI: Demetries Coia is a 73 y.o. male with a history of previous seizure in January of this year. He has a history of a previous large right MCA territory stroke and is managed on Coumadin. Of note, he had an INR checked yesterday which was subtherapeutic at 1.6.   He began having partial shaking around 8:30 PM for which EMS was called. On their arrival, he was still lucid and able to give a history. He had shaking of his left arm and leg. En route, he began having shaking of his left face as well. On arrival, he was still able to give his name, but subsequently became unresponsive with left arm face and leg twitching.  He was given a total of 6 mg of IV Ativan with cessation of the movements. He was loaded with IV phenytoin, this has been partially loaded but he had some blood pressure issues. He is getting some fluids before restarting the bolus.  Of note, in his medications he has ciprofloxacin that was prescribed on 4/24.  LKW: 8:30 PM tpa given?: no, ongoing status epilepticus  ROS: Unable to assess secondary to patient's altered mental status.    Past Medical History  Diagnosis Date  . Cancer 2002    colon  . Stroke     Family History: Unable to assess secondary to patient's altered mental status.    Social History: Tob: Unable to assess secondary to patient's altered mental status.    Exam: Current vital signs: There were no vitals filed for this visit. BP MAP 64 Pulse low 100s Temp afebrile Vital signs in last 24 hours:    General: in bed, NRB in place CV: taachycardic Mental Status: Patient is obtunded, does not answer questions or follow commands.   Cranial Nerves: II: Does not blink to threat. Pupils are equal, round, and reactive to light.  Discs are difficult to visualize. III,IV, VI: does not cross midline  to the right, left gaze preference V: VII: corneals intact VIII, X, XI, XII: Unable to assess secondary to patient's altered mental status.  Motor: does not respond to noxious stimuli in any of the 4 ext, rhythmic jerking of the left arm and leg.  Sensory: As above.  Deep Tendon Reflexes: Unable to ellicit due to jerking.  Cerebellar: Unable to assess secondary to patient's altered mental status.  Gait: Unable to assess secondary to patient's altered mental status.    I have reviewed labs in epic and the results pertinent to this consultation are: Mild leukocytosis.   I have reviewed the images obtained:CT head - old CVA  Impression: 73 year old male with known seizure disorder presenting with partial status epilepticus. Following Ativan impartial Dilantin load, he appears to have broken, but remains obtunded. Given the diminishing character of his twitching and persistent obtundation I am concerned for possible subclinical status epilepticus and therefore have called in an EEG tech.  I suspect that his breakthrough seizure may be caused by multiple factors lowering his seizure threshold. an underlying infection could be lowering seizure threshold given that he was recently started on ciprofloxacin. Also fluoroquinolones lower seizure threshold considerably as well.  Recommendations: 1) finish load of Dilantin milligrams per kilogram then 100 mg 3 times a day 2) continue home dose of Keppra 500 mg twice a day 3) stat EEG to rule out ongoing subclinical status epilepticus  This patient  is critically ill and at significant risk of neurological worsening, death and care requires constant monitoring of vital signs, hemodynamics,respiratory and cardiac monitoring, neurological assessment, discussion with family, other specialists and medical decision making of high complexity. I spent 60 minutes of neurocritical care time  in the care of  this patient.  Roland Rack, MD [facility] [phone]  If 7pm- 7am, please page neurology on call as listed in [place]. 10/11/2013  11:43 PM

## 2013-10-11 NOTE — ED Notes (Signed)
Dr. Tobias Alexander order another 2mg  of Ativan for the patient and ordered a Dilantin drip.  Both were given and charted.

## 2013-10-11 NOTE — ED Notes (Signed)
Patient began to have a grand mal seizure and Dr. Jodi Mourning order 2mg  of Ativan and we placed the patient on a non-rebreather.  Patient's oxygen level was 96% prior to putting him on oxygen.  Patient remained at 100%.

## 2013-10-12 ENCOUNTER — Emergency Department (HOSPITAL_COMMUNITY): Payer: Medicare Other

## 2013-10-12 ENCOUNTER — Other Ambulatory Visit (HOSPITAL_COMMUNITY): Payer: Self-pay | Admitting: *Deleted

## 2013-10-12 ENCOUNTER — Inpatient Hospital Stay (HOSPITAL_COMMUNITY): Payer: Medicare Other

## 2013-10-12 DIAGNOSIS — Z87891 Personal history of nicotine dependence: Secondary | ICD-10-CM | POA: Diagnosis not present

## 2013-10-12 DIAGNOSIS — Z8551 Personal history of malignant neoplasm of bladder: Secondary | ICD-10-CM | POA: Diagnosis not present

## 2013-10-12 DIAGNOSIS — Z936 Other artificial openings of urinary tract status: Secondary | ICD-10-CM | POA: Diagnosis not present

## 2013-10-12 DIAGNOSIS — Z836 Family history of other diseases of the respiratory system: Secondary | ICD-10-CM | POA: Diagnosis not present

## 2013-10-12 DIAGNOSIS — I959 Hypotension, unspecified: Secondary | ICD-10-CM | POA: Diagnosis present

## 2013-10-12 DIAGNOSIS — I509 Heart failure, unspecified: Secondary | ICD-10-CM | POA: Diagnosis present

## 2013-10-12 DIAGNOSIS — E440 Moderate protein-calorie malnutrition: Secondary | ICD-10-CM | POA: Diagnosis present

## 2013-10-12 DIAGNOSIS — J9600 Acute respiratory failure, unspecified whether with hypoxia or hypercapnia: Secondary | ICD-10-CM

## 2013-10-12 DIAGNOSIS — D509 Iron deficiency anemia, unspecified: Secondary | ICD-10-CM | POA: Diagnosis present

## 2013-10-12 DIAGNOSIS — Z8673 Personal history of transient ischemic attack (TIA), and cerebral infarction without residual deficits: Secondary | ICD-10-CM

## 2013-10-12 DIAGNOSIS — I5023 Acute on chronic systolic (congestive) heart failure: Secondary | ICD-10-CM | POA: Diagnosis not present

## 2013-10-12 DIAGNOSIS — N183 Chronic kidney disease, stage 3 unspecified: Secondary | ICD-10-CM | POA: Diagnosis present

## 2013-10-12 DIAGNOSIS — Z8249 Family history of ischemic heart disease and other diseases of the circulatory system: Secondary | ICD-10-CM | POA: Diagnosis not present

## 2013-10-12 DIAGNOSIS — N390 Urinary tract infection, site not specified: Secondary | ICD-10-CM

## 2013-10-12 DIAGNOSIS — R569 Unspecified convulsions: Secondary | ICD-10-CM | POA: Diagnosis present

## 2013-10-12 DIAGNOSIS — I252 Old myocardial infarction: Secondary | ICD-10-CM | POA: Diagnosis not present

## 2013-10-12 DIAGNOSIS — I4891 Unspecified atrial fibrillation: Secondary | ICD-10-CM

## 2013-10-12 DIAGNOSIS — Z66 Do not resuscitate: Secondary | ICD-10-CM | POA: Diagnosis not present

## 2013-10-12 DIAGNOSIS — R4182 Altered mental status, unspecified: Secondary | ICD-10-CM

## 2013-10-12 DIAGNOSIS — I214 Non-ST elevation (NSTEMI) myocardial infarction: Secondary | ICD-10-CM | POA: Diagnosis not present

## 2013-10-12 DIAGNOSIS — E872 Acidosis, unspecified: Secondary | ICD-10-CM | POA: Diagnosis present

## 2013-10-12 DIAGNOSIS — Z7901 Long term (current) use of anticoagulants: Secondary | ICD-10-CM | POA: Diagnosis not present

## 2013-10-12 DIAGNOSIS — Z933 Colostomy status: Secondary | ICD-10-CM | POA: Diagnosis not present

## 2013-10-12 DIAGNOSIS — I2589 Other forms of chronic ischemic heart disease: Secondary | ICD-10-CM | POA: Diagnosis present

## 2013-10-12 DIAGNOSIS — K219 Gastro-esophageal reflux disease without esophagitis: Secondary | ICD-10-CM | POA: Diagnosis present

## 2013-10-12 DIAGNOSIS — G894 Chronic pain syndrome: Secondary | ICD-10-CM | POA: Diagnosis present

## 2013-10-12 DIAGNOSIS — I428 Other cardiomyopathies: Secondary | ICD-10-CM | POA: Diagnosis present

## 2013-10-12 DIAGNOSIS — J9620 Acute and chronic respiratory failure, unspecified whether with hypoxia or hypercapnia: Secondary | ICD-10-CM | POA: Diagnosis present

## 2013-10-12 DIAGNOSIS — Z951 Presence of aortocoronary bypass graft: Secondary | ICD-10-CM | POA: Diagnosis not present

## 2013-10-12 DIAGNOSIS — Z79899 Other long term (current) drug therapy: Secondary | ICD-10-CM | POA: Diagnosis not present

## 2013-10-12 DIAGNOSIS — I2510 Atherosclerotic heart disease of native coronary artery without angina pectoris: Secondary | ICD-10-CM | POA: Diagnosis present

## 2013-10-12 DIAGNOSIS — I129 Hypertensive chronic kidney disease with stage 1 through stage 4 chronic kidney disease, or unspecified chronic kidney disease: Secondary | ICD-10-CM

## 2013-10-12 DIAGNOSIS — G40109 Localization-related (focal) (partial) symptomatic epilepsy and epileptic syndromes with simple partial seizures, not intractable, without status epilepticus: Secondary | ICD-10-CM | POA: Diagnosis present

## 2013-10-12 DIAGNOSIS — E876 Hypokalemia: Secondary | ICD-10-CM | POA: Diagnosis not present

## 2013-10-12 DIAGNOSIS — Z85038 Personal history of other malignant neoplasm of large intestine: Secondary | ICD-10-CM | POA: Diagnosis not present

## 2013-10-12 DIAGNOSIS — IMO0002 Reserved for concepts with insufficient information to code with codable children: Secondary | ICD-10-CM | POA: Diagnosis not present

## 2013-10-12 DIAGNOSIS — A0472 Enterocolitis due to Clostridium difficile, not specified as recurrent: Secondary | ICD-10-CM

## 2013-10-12 DIAGNOSIS — Z7982 Long term (current) use of aspirin: Secondary | ICD-10-CM | POA: Diagnosis not present

## 2013-10-12 LAB — URINALYSIS, ROUTINE W REFLEX MICROSCOPIC
BILIRUBIN URINE: NEGATIVE
GLUCOSE, UA: 100 mg/dL — AB
Ketones, ur: 15 mg/dL — AB
Nitrite: NEGATIVE
PROTEIN: NEGATIVE mg/dL
Specific Gravity, Urine: 1.014 (ref 1.005–1.030)
Urobilinogen, UA: 0.2 mg/dL (ref 0.0–1.0)
pH: 6 (ref 5.0–8.0)

## 2013-10-12 LAB — I-STAT ARTERIAL BLOOD GAS, ED
ACID-BASE DEFICIT: 10 mmol/L — AB (ref 0.0–2.0)
Acid-base deficit: 9 mmol/L — ABNORMAL HIGH (ref 0.0–2.0)
BICARBONATE: 19.3 meq/L — AB (ref 20.0–24.0)
Bicarbonate: 16.9 mEq/L — ABNORMAL LOW (ref 20.0–24.0)
O2 Saturation: 100 %
O2 Saturation: 90 %
PH ART: 7.249 — AB (ref 7.350–7.450)
PO2 ART: 293 mmHg — AB (ref 80.0–100.0)
Patient temperature: 98.7
TCO2: 18 mmol/L (ref 0–100)
TCO2: 21 mmol/L (ref 0–100)
pCO2 arterial: 38.6 mmHg (ref 35.0–45.0)
pCO2 arterial: 51.4 mmHg — ABNORMAL HIGH (ref 35.0–45.0)
pH, Arterial: 7.184 — CL (ref 7.350–7.450)
pO2, Arterial: 68 mmHg — ABNORMAL LOW (ref 80.0–100.0)

## 2013-10-12 LAB — BLOOD GAS, ARTERIAL
ACID-BASE DEFICIT: 9.1 mmol/L — AB (ref 0.0–2.0)
Bicarbonate: 13.9 mEq/L — ABNORMAL LOW (ref 20.0–24.0)
DRAWN BY: 277331
FIO2: 0.4 %
O2 Saturation: 99.5 %
PCO2 ART: 18.8 mmHg — AB (ref 35.0–45.0)
PEEP/CPAP: 5 cmH2O
PO2 ART: 170 mmHg — AB (ref 80.0–100.0)
Patient temperature: 97.9
RATE: 20 resp/min
TCO2: 14.5 mmol/L (ref 0–100)
VT: 650 mL
pH, Arterial: 7.48 — ABNORMAL HIGH (ref 7.350–7.450)

## 2013-10-12 LAB — GLUCOSE, CAPILLARY
GLUCOSE-CAPILLARY: 84 mg/dL (ref 70–99)
Glucose-Capillary: 106 mg/dL — ABNORMAL HIGH (ref 70–99)
Glucose-Capillary: 135 mg/dL — ABNORMAL HIGH (ref 70–99)
Glucose-Capillary: 86 mg/dL (ref 70–99)

## 2013-10-12 LAB — RAPID URINE DRUG SCREEN, HOSP PERFORMED
Amphetamines: NOT DETECTED
Barbiturates: NOT DETECTED
Benzodiazepines: POSITIVE — AB
Cocaine: NOT DETECTED
Opiates: NOT DETECTED
Tetrahydrocannabinol: NOT DETECTED

## 2013-10-12 LAB — TSH: TSH: 1.02 u[IU]/mL (ref 0.350–4.500)

## 2013-10-12 LAB — BASIC METABOLIC PANEL
BUN: 20 mg/dL (ref 6–23)
BUN: 19 mg/dL (ref 6–23)
BUN: 16 mg/dL (ref 6–23)
CALCIUM: 8.2 mg/dL — AB (ref 8.4–10.5)
CO2: 14 mEq/L — ABNORMAL LOW (ref 19–32)
CO2: 14 mEq/L — ABNORMAL LOW (ref 19–32)
CO2: 15 mEq/L — ABNORMAL LOW (ref 19–32)
CREATININE: 1.4 mg/dL — AB (ref 0.50–1.35)
Calcium: 8.7 mg/dL (ref 8.4–10.5)
Calcium: 7.9 mg/dL — ABNORMAL LOW (ref 8.4–10.5)
Chloride: 106 mEq/L (ref 96–112)
Chloride: 109 mEq/L (ref 96–112)
Chloride: 113 mEq/L — ABNORMAL HIGH (ref 96–112)
Creatinine, Ser: 1.42 mg/dL — ABNORMAL HIGH (ref 0.50–1.35)
Creatinine, Ser: 1.35 mg/dL (ref 0.50–1.35)
GFR calc Af Amer: 57 mL/min — ABNORMAL LOW (ref >90)
GFR calc Af Amer: 56 mL/min — ABNORMAL LOW (ref >90)
GFR calc non Af Amer: 51 mL/min — ABNORMAL LOW (ref >90)
GFR, EST AFRICAN AMERICAN: 59 mL/min — AB (ref >90)
GFR, EST NON AFRICAN AMERICAN: 49 mL/min — AB (ref >90)
GFR, EST NON AFRICAN AMERICAN: 48 mL/min — AB (ref >90)
Glucose, Bld: 143 mg/dL — ABNORMAL HIGH (ref 70–99)
Glucose, Bld: 102 mg/dL — ABNORMAL HIGH (ref 70–99)
Glucose, Bld: 123 mg/dL — ABNORMAL HIGH (ref 70–99)
POTASSIUM: 5.1 meq/L (ref 3.7–5.3)
POTASSIUM: 3.8 meq/L (ref 3.7–5.3)
Potassium: 4.4 mEq/L (ref 3.7–5.3)
SODIUM: 139 meq/L (ref 137–147)
Sodium: 138 mEq/L (ref 137–147)
Sodium: 142 mEq/L (ref 137–147)

## 2013-10-12 LAB — MRSA PCR SCREENING: MRSA by PCR: POSITIVE — AB

## 2013-10-12 LAB — CK: Total CK: 139 U/L (ref 7–232)

## 2013-10-12 LAB — URINE MICROSCOPIC-ADD ON

## 2013-10-12 LAB — POTASSIUM: POTASSIUM: 4.1 meq/L (ref 3.7–5.3)

## 2013-10-12 LAB — CORTISOL: CORTISOL PLASMA: 16.8 ug/dL

## 2013-10-12 LAB — TROPONIN I
TROPONIN I: 2.03 ng/mL — AB (ref <0.30)
Troponin I: 0.43 ng/mL (ref <0.30)
Troponin I: 1.96 ng/mL (ref <0.30)
Troponin I: 2.33 ng/mL (ref <0.30)

## 2013-10-12 LAB — PROTIME-INR
INR: 1.83 — ABNORMAL HIGH (ref 0.00–1.49)
Prothrombin Time: 20.6 seconds — ABNORMAL HIGH (ref 11.6–15.2)

## 2013-10-12 LAB — PRO B NATRIURETIC PEPTIDE: Pro B Natriuretic peptide (BNP): 2369 pg/mL — ABNORMAL HIGH (ref 0–125)

## 2013-10-12 LAB — OSMOLALITY: Osmolality: 296 mOsm/kg (ref 275–300)

## 2013-10-12 LAB — PROCALCITONIN: Procalcitonin: <0.1 ng/mL

## 2013-10-12 LAB — LACTIC ACID, PLASMA
Lactic Acid, Venous: 1.8 mmol/L (ref 0.5–2.2)
Lactic Acid, Venous: 3.1 mmol/L — ABNORMAL HIGH (ref 0.5–2.2)

## 2013-10-12 LAB — MAGNESIUM: Magnesium: 2 mg/dL (ref 1.5–2.5)

## 2013-10-12 MED ORDER — ASPIRIN 300 MG RE SUPP
300.0000 mg | RECTAL | Status: AC
Start: 2013-10-12 — End: 2013-10-12
  Administered 2013-10-12: 300 mg via RECTAL
  Filled 2013-10-12: qty 1

## 2013-10-12 MED ORDER — ETOMIDATE 2 MG/ML IV SOLN
INTRAVENOUS | Status: DC | PRN
  Administered 2013-10-12: 10 mg via INTRAVENOUS

## 2013-10-12 MED ORDER — CARVEDILOL 12.5 MG PO TABS
12.5000 mg | ORAL_TABLET | Freq: Two times a day (BID) | ORAL | Status: DC
  Filled 2013-10-12 (×3): qty 1

## 2013-10-12 MED ORDER — PHENYTOIN SODIUM 50 MG/ML IJ SOLN
100.0000 mg | Freq: Three times a day (TID) | INTRAMUSCULAR | Status: DC
  Administered 2013-10-12 – 2013-10-15 (×11): 100 mg via INTRAVENOUS
  Filled 2013-10-12 (×13): qty 2

## 2013-10-12 MED ORDER — HEPARIN SODIUM (PORCINE) 5000 UNIT/ML IJ SOLN
5000.0000 [IU] | Freq: Three times a day (TID) | INTRAMUSCULAR | Status: DC
  Administered 2013-10-12 – 2013-10-13 (×5): 5000 [IU] via SUBCUTANEOUS
  Filled 2013-10-12 (×8): qty 1

## 2013-10-12 MED ORDER — MAGNESIUM SULFATE 40 MG/ML IJ SOLN
2.0000 g | Freq: Once | INTRAMUSCULAR | Status: AC
  Administered 2013-10-12: 2 g via INTRAVENOUS
  Filled 2013-10-12: qty 50

## 2013-10-12 MED ORDER — VITAL AF 1.2 CAL PO LIQD
1000.0000 mL | ORAL | Status: DC
  Filled 2013-10-12: qty 1000

## 2013-10-12 MED ORDER — SUCCINYLCHOLINE CHLORIDE 20 MG/ML IJ SOLN
INTRAMUSCULAR | Status: AC
  Filled 2013-10-12: qty 1

## 2013-10-12 MED ORDER — FENTANYL CITRATE 0.05 MG/ML IJ SOLN
50.0000 ug | Freq: Once | INTRAMUSCULAR | Status: AC
  Administered 2013-10-12: 50 ug via INTRAVENOUS

## 2013-10-12 MED ORDER — ASPIRIN 81 MG PO CHEW: 324.0000 mg | CHEWABLE_TABLET | ORAL | Status: AC

## 2013-10-12 MED ORDER — FAMOTIDINE IN NACL 20-0.9 MG/50ML-% IV SOLN
20.0000 mg | Freq: Two times a day (BID) | INTRAVENOUS | Status: DC
  Administered 2013-10-12 – 2013-10-13 (×4): 20 mg via INTRAVENOUS
  Filled 2013-10-12 (×6): qty 50

## 2013-10-12 MED ORDER — SODIUM CHLORIDE 0.9 % IV SOLN: 500.0000 mg | Freq: Two times a day (BID) | INTRAVENOUS | Status: DC

## 2013-10-12 MED ORDER — VITAMIN D3 25 MCG (1000 UNIT) PO TABS
2000.0000 [IU] | ORAL_TABLET | Freq: Every day | ORAL | Status: DC
  Administered 2013-10-12 – 2013-10-13 (×2): 2000 [IU] via ORAL
  Filled 2013-10-12 (×3): qty 2

## 2013-10-12 MED ORDER — SODIUM CHLORIDE 0.9 % IV SOLN: 250.0000 mL | INTRAVENOUS | Status: DC | PRN

## 2013-10-12 MED ORDER — ETOMIDATE 2 MG/ML IV SOLN
INTRAVENOUS | Status: AC
  Administered 2013-10-12: 10 mg
  Filled 2013-10-12: qty 20

## 2013-10-12 MED ORDER — WARFARIN SODIUM 4 MG PO TABS
4.0000 mg | ORAL_TABLET | Freq: Once | ORAL | Status: AC
  Administered 2013-10-12: 4 mg via ORAL
  Filled 2013-10-12: qty 1

## 2013-10-12 MED ORDER — FENTANYL BOLUS VIA INFUSION
25.0000 ug | INTRAVENOUS | Status: DC | PRN
  Filled 2013-10-12: qty 50

## 2013-10-12 MED ORDER — SODIUM CHLORIDE 0.9 % IV BOLUS (SEPSIS): 1000.0000 mL | Freq: Once | INTRAVENOUS | Status: DC

## 2013-10-12 MED ORDER — SODIUM CHLORIDE 0.9 % IV BOLUS (SEPSIS)
1000.0000 mL | Freq: Once | INTRAVENOUS | Status: AC
  Administered 2013-10-12: 1000 mL via INTRAVENOUS

## 2013-10-12 MED ORDER — SODIUM CHLORIDE 0.9 % IV SOLN
INTRAVENOUS | Status: DC
  Administered 2013-10-13 – 2013-10-18 (×2): via INTRAVENOUS

## 2013-10-12 MED ORDER — SODIUM CHLORIDE 0.9 % IV SOLN
2.0000 mg/h | INTRAVENOUS | Status: DC
  Administered 2013-10-12: 2 mg/h via INTRAVENOUS
  Filled 2013-10-12: qty 10

## 2013-10-12 MED ORDER — CHLORHEXIDINE GLUCONATE 0.12 % MT SOLN
15.0000 mL | Freq: Two times a day (BID) | OROMUCOSAL | Status: DC
  Administered 2013-10-12 – 2013-10-19 (×15): 15 mL via OROMUCOSAL
  Filled 2013-10-12 (×15): qty 15

## 2013-10-12 MED ORDER — SIMVASTATIN 20 MG PO TABS
20.0000 mg | ORAL_TABLET | Freq: Every day | ORAL | Status: DC
  Administered 2013-10-12 – 2013-10-13 (×2): 20 mg via ORAL
  Filled 2013-10-12 (×3): qty 1

## 2013-10-12 MED ORDER — SODIUM CHLORIDE 0.9 % IV SOLN
1500.0000 mg | Freq: Once | INTRAVENOUS | Status: AC
  Administered 2013-10-12: 1500 mg via INTRAVENOUS
  Filled 2013-10-12: qty 15

## 2013-10-12 MED ORDER — FENTANYL CITRATE 0.05 MG/ML IJ SOLN
25.0000 ug | INTRAMUSCULAR | Status: DC | PRN
  Administered 2013-10-12 – 2013-10-13 (×3): 50 ug via INTRAVENOUS
  Administered 2013-10-13: 100 ug via INTRAVENOUS
  Administered 2013-10-13 (×2): 50 ug via INTRAVENOUS
  Filled 2013-10-12 (×6): qty 2

## 2013-10-12 MED ORDER — INSULIN ASPART 100 UNIT/ML ~~LOC~~ SOLN
0.0000 [IU] | SUBCUTANEOUS | Status: DC
  Administered 2013-10-12: 1 [IU] via SUBCUTANEOUS
  Administered 2013-10-14: 2 [IU] via SUBCUTANEOUS

## 2013-10-12 MED ORDER — BIOTENE DRY MOUTH MT LIQD
15.0000 mL | Freq: Four times a day (QID) | OROMUCOSAL | Status: DC
  Administered 2013-10-12 – 2013-10-19 (×27): 15 mL via OROMUCOSAL

## 2013-10-12 MED ORDER — JEVITY 1.2 CAL PO LIQD
1000.0000 mL | ORAL | Status: DC
  Filled 2013-10-12 (×2): qty 1000

## 2013-10-12 MED ORDER — PROPOFOL 10 MG/ML IV EMUL
INTRAVENOUS | Status: AC
  Filled 2013-10-12: qty 100

## 2013-10-12 MED ORDER — SODIUM CHLORIDE 0.9 % IV SOLN
0.0000 ug/h | INTRAVENOUS | Status: DC
  Administered 2013-10-12: 50 ug/h via INTRAVENOUS
  Filled 2013-10-12: qty 50

## 2013-10-12 MED ORDER — VITAL AF 1.2 CAL PO LIQD
1000.0000 mL | ORAL | Status: DC
Start: 2013-10-12 — End: 2013-10-15
  Administered 2013-10-12: 1000 mL
  Filled 2013-10-12 (×6): qty 1000

## 2013-10-12 MED ORDER — ROCURONIUM BROMIDE 50 MG/5ML IV SOLN
INTRAVENOUS | Status: DC | PRN
  Administered 2013-10-12: 70 mg via INTRAVENOUS
  Administered 2013-10-14: 40 mg via INTRAVENOUS

## 2013-10-12 MED ORDER — ROCURONIUM BROMIDE 50 MG/5ML IV SOLN
INTRAVENOUS | Status: AC
  Filled 2013-10-12: qty 2

## 2013-10-12 MED ORDER — PROPOFOL 10 MG/ML IV EMUL
0.0000 ug/kg/min | INTRAVENOUS | Status: DC
  Administered 2013-10-12 – 2013-10-13 (×2): 30 ug/kg/min via INTRAVENOUS
  Filled 2013-10-12 (×2): qty 100

## 2013-10-12 MED ORDER — VITAL HIGH PROTEIN PO LIQD
1000.0000 mL | ORAL | Status: DC
  Filled 2013-10-12 (×2): qty 1000

## 2013-10-12 MED ORDER — CEFTAZIDIME 1 G IJ SOLR
1.0000 g | Freq: Two times a day (BID) | INTRAMUSCULAR | Status: DC
  Administered 2013-10-12 – 2013-10-13 (×4): 1 g via INTRAVENOUS
  Filled 2013-10-12 (×5): qty 1

## 2013-10-12 MED ORDER — SODIUM CHLORIDE 0.9 % IV SOLN
1000.0000 mg | Freq: Two times a day (BID) | INTRAVENOUS | Status: DC
  Administered 2013-10-12 – 2013-10-16 (×10): 1000 mg via INTRAVENOUS
  Filled 2013-10-12 (×11): qty 10

## 2013-10-12 MED ORDER — LIDOCAINE HCL (CARDIAC) 20 MG/ML IV SOLN
INTRAVENOUS | Status: AC
  Filled 2013-10-12: qty 5

## 2013-10-12 MED ORDER — WARFARIN - PHARMACIST DOSING INPATIENT
Freq: Every day | Status: DC
Start: 2013-10-12 — End: 2013-10-19
  Administered 2013-10-12 – 2013-10-19 (×2)

## 2013-10-12 MED ORDER — SODIUM CHLORIDE 0.9 % IV SOLN
INTRAVENOUS | Status: DC
  Administered 2013-10-12 (×2): via INTRAVENOUS

## 2013-10-12 MED ORDER — SODIUM CHLORIDE 0.9 % IV BOLUS (SEPSIS)
500.0000 mL | Freq: Once | INTRAVENOUS | Status: AC
  Administered 2013-10-12: 500 mL via INTRAVENOUS

## 2013-10-12 MED ORDER — PANTOPRAZOLE SODIUM 40 MG IV SOLR
40.0000 mg | INTRAVENOUS | Status: DC
  Administered 2013-10-12 – 2013-10-17 (×6): 40 mg via INTRAVENOUS
  Filled 2013-10-12 (×8): qty 40

## 2013-10-12 MED ORDER — FENTANYL CITRATE 0.05 MG/ML IJ SOLN: 25.0000 ug | INTRAMUSCULAR | Status: DC | PRN

## 2013-10-12 MED ORDER — ARTIFICIAL TEARS OP OINT
TOPICAL_OINTMENT | OPHTHALMIC | Status: DC | PRN
  Filled 2013-10-12: qty 3.5

## 2013-10-12 NOTE — H&P (Signed)
PULMONARY / CRITICAL CARE MEDICINE   Name: [name] [name] MRN: [id number] DOB: 01/27/1941    ADMISSION DATE:  10/11/2013 CONSULTATION DATE:  10/12/2013  REFERRING MD :  ED PRIMARY SERVICE: PCCM  CHIEF COMPLAINT:  Seizures  BRIEF PATIENT DESCRIPTION: [name] [name] is a 73 y.o. male with a PMH of CVA (06/2013 presented with seizures), metastatic [name] cancer, paroxysmal atrial fibrillation, who presents to ED with seizures.     SIGNIFICANT EVENTS / STUDIES:  4/30 CT head: No acute process; large remote R MCA infarct 4/30 Admit with seizures 4/30 pCXR: Interstitial edema   LINES / TUBES: PIV 4/29>> OETT 4/30>>  CULTURES: MRSA PCR 4/29>>  BC 4/30>> UA 4/30>> UC 4/30>>  ANTIBIOTICS:   HISTORY OF PRESENT ILLNESS:  [name] [name] is a 73 y.o. 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  Also with h/o paroxysmal afib, CABG, CKD, and chronic pain.  History obtained from chart review.  He presents to ED with seizures.  According to chart review, pt brought in by EMS and had been "twitching" since 6PM today and was concerned he was having another stroke since this is how he presented with his previous stroke.  Upon arrival to the ED, he began shaking of his left arm and leg.  Pt became unresponsive and with left arm, face, and leg twitching.  He was intubated and given Ativan 6mg  total with cessation of movements.  He was seen by neurology and loaded with dilantin.  According to chart review, he was taking keppra 500mg  bid since 06/2013.  He was apparently started on coumadin after his 06/2012 stroke for paroxysmal atrial fibrillation.  Of note, he was recently started on cymbalta for chronic pain which could also have contributed to his recent seizure activity.    PAST MEDICAL HISTORY :   Past Medical History  Diagnosis Date  . Cancer 2002    [name]  . Stroke    Past Surgical History  Procedure Laterality Date  . [name] surgery    . Coronary artery bypass graft     Prior to Admission medications   Medication Sig Start Date End Date Taking? Authorizing Provider  carvedilol (COREG) 12.5 MG tablet Take 12.5 mg by mouth 2 (two) times daily with a meal.    Historical Provider, MD  cholecalciferol (VITAMIN D) 1000 UNITS tablet Take 2,000 Units by mouth daily.     Historical Provider, MD  diltiazem (CARTIA XT) 120 MG 24 hr capsule Take 120 mg by mouth daily.    Historical Provider, MD  DULoxetine (CYMBALTA) 60 MG capsule Take 1 capsule (60 mg total) by mouth daily. 09/25/13   Lysbeth Penner, FNP  ENSURE PLUS (ENSURE PLUS) LIQD Take 237 mLs by mouth 3 (three) times daily between meals. 09/18/13   Lysbeth Penner, FNP  levETIRAcetam (KEPPRA) 500 MG tablet Take 1 tablet (500 mg total) by mouth 2 (two) times daily. 08/21/13   Lysbeth Penner, FNP  lisinopril (PRINIVIL,ZESTRIL) 2.5 MG tablet Take 2.5 mg by mouth daily.    Historical Provider, MD  Multiple Vitamins-Iron (MULTIVITAMINS WITH IRON) TABS tablet Take 1 tablet by mouth daily.    Historical Provider, MD  oxycodone (OXY-IR) 5 MG capsule Take one po q 4 hours prn breakthrough pain 09/05/13   Lysbeth Penner, FNP  OxyCODONE (OXYCONTIN) 40 mg T12A  12 hr tablet Take 1 tablet (40 mg total) by mouth every 12 (twelve) hours. 09/25/13   Lysbeth Penner, FNP  pantoprazole (PROTONIX) 40 MG tablet Take 1 tablet (40 mg total) by mouth 2 (two) times daily. 08/21/13   Lysbeth Penner, FNP  pravastatin (PRAVACHOL) 40 MG tablet Take 1 tablet (40 mg total) by mouth daily. 08/21/13   Lysbeth Penner, FNP  warfarin (COUMADIN) 2 MG tablet Take 1 tablet (2 mg total) by mouth daily. 09/18/13   Lysbeth Penner, FNP   No Known Allergies  FAMILY HISTORY:  Family History  Problem Relation Age of Onset  . Heart disease Mother   . COPD Father     SOCIAL HISTORY:  reports that he quit smoking about 17 years ago. His smoking use included Cigarettes. He started smoking about 54 years ago. He smoked 1.00 pack per day. He does not have any smokeless tobacco history on file. He reports that he does not drink alcohol or use illicit drugs.  REVIEW OF SYSTEMS:  Unable to obtain.  VITAL SIGNS: Pulse Rate:  [80-88] 83 (04/30 0200) Resp:  [11-18] 16 (04/30 0200) BP: (97-168)/(45-96) 168/94 mmHg (04/30 0200) SpO2:  [93 %-99 %] 93 % (04/30 0200) FiO2 (%):  [40 %] 40 % (04/30 0100)  HEMODYNAMICS:   VENTILATOR SETTINGS: Vent Mode:  [-] PRVC FiO2 (%):  [40 %] 40 % Set Rate:  [16 bmp] 16 bmp Vt Set:  [460 mL] 460 mL PEEP:  [5 cmH20] 5 cmH20 Plateau Pressure:  [13 cmH20] 13 cmH20  INTAKE / OUTPUT: Intake/Output   None    PHYSICAL EXAMINATION: General:  Intubated.  Neuro:  Unable to assess  HEENT:  left pupil R, right pupil NR Cardiovascular:  tachycardic, regular rhythm  Chest/Lungs:  sternal scar, diffuse rhonchi Abdomen:  +bs, soft, non-distended; urostomy and colostomy present Musculoskeletal:  no obvious deformities Skin:  warm, dry; without edema  LABS:  CBC  Recent Labs Lab 10/11/13 2252  WBC 11.7*  HGB 12.0*  HCT 35.5*  PLT 321   Coag's  Recent Labs Lab 10/10/13 0908  INR 1.6   BMET  Recent Labs Lab 10/11/13 2252 10/11/13 2337  NA 140  --   K 4.2 4.1  CL 106  --   CO2 19  --   BUN 20  --   CREATININE 1.60*  --   GLUCOSE 127*  --    Electrolytes  Recent Labs Lab 10/11/13 2252  CALCIUM 9.3  MG 1.7  PHOS 3.9   Sepsis Markers No results found for this basename: LATICACIDVEN, PROCALCITON, O2SATVEN,  in the last 168 hours ABG  Recent Labs Lab 10/12/13 0037 10/12/13 0216  PHART 7.184* 7.249*  PCO2ART 51.4* 38.6  PO2ART 293.0* 68.0*   Liver Enzymes  Recent Labs Lab 10/11/13 2252  AST 17  ALT 8  ALKPHOS 52  BILITOT 0.4  ALBUMIN 3.7   Cardiac Enzymes No results found for this  basename: TROPONINI, PROBNP,  in the last 168 hours Glucose  Recent Labs Lab 10/11/13 2227 10/11/13 2323  GLUCAP 111* 128*    Imaging Ct Head Wo Contrast  10/11/2013   CLINICAL DATA:  Seizure  EXAM: CT HEAD WITHOUT CONTRAST  TECHNIQUE: Contiguous axial images were obtained from the base of the skull through the vertex without intravenous contrast.  COMPARISON:  Prior CT from 10/03/2013  FINDINGS: Extensive encephalomalacia throughout the right MCA territory again seen, compatible with remote right MCA territory infarct. Apparent differences in area  of the encephalomalacia as compared to the recent exam likely related to angulation of the gantry. Generalized atrophy again noted. No acute intracranial hemorrhage or infarct. No mass lesion or midline shift. No extra-axial fluid collection. Atherosclerotic calcifications within the carotid siphons and bilateral vertebral arteries noted.  Calvarium is normal. Scalp soft tissues unremarkable. Orbits are within normal limits. The paranasal sinuses and mastoid air cells are well pneumatized and free of fluid.  IMPRESSION: 1. No acute intracranial process. 2. Large remote right MCA territory infarct.   Electronically Signed   By: Jeannine Boga M.D.   On: 10/11/2013 23:15   Dg Chest Port 1 View  10/12/2013   CLINICAL DATA:  Assess endotracheal tube position  EXAM: PORTABLE CHEST - 1 VIEW  COMPARISON:  DG CHEST 1V PORT dated 08/27/2013  FINDINGS: The endotracheal tube tip lies approximately 3 cm above the crotch of the carina. The lungs are well-expanded. The interstitial markings are increased bilaterally. There is no pleural effusion or pneumothorax. The cardiopericardial silhouette is normal in size. The pulmonary vascularity is indistinct. There are 5 sternal wires visible. The observed portions of the bony thorax appear normal.  IMPRESSION: 1. The endotracheal tube appears be in reasonable position 3 cm above the crotch of the carina. 2. The pulmonary  interstitial markings are increased. This may reflect pulmonary interstitial edema of cardiac or noncardiac cause. There is no alveolar pneumonia.   Electronically Signed   By: David  Martinique   On: 10/12/2013 01:25   ASSESSMENT / PLAN:  PULMONARY A:   - No acute issues, but risk for airway compromise due to seizure - CXR: The pulmonary interstitial markings are increased-->possible pulmonary interstitial edema? No infiltration  P: -->  Full mechanical support -->  Goal pH>7.30, goal SpO2>92 -->  Follow up ABG at 2:30 AM -->  Daily SBT  CARDIOVASCULAR A:  - hx of CAD, s/p CABG - hx of PAF per neurology's note 08/22/13. On Coumadin, but INR subtherapeutic 1.6 on admission. - CHADS score=3, should be OK for not bridging with heparin. - hx of stroke, MCA territory. - HTN-->now bp soft on admission  P:  - hold home Cardizem, lisinopril due to soft blood pressure on admission secondary to sedative medications - continue coreg 12.5mg  bid - continue coumdin - gentle IVF (NS 100cc/h) due to possibe pulmonary edema-->pending ProBNP - check trop x 2  RENAL A:   - hx of bladder cancer-->s/p of urostomy-->no signs of infection around urostomy - Bladder involvement from metastasized [name] cancer-->s/p of cystectomy and urostomy tube. - hx of CKD-III with BL cre 1.33 to 2.34. creatinine slightly elevated from 1.33 on 3/24-->1.66, but seems be to in his baseline range - HypoMG, Mg 1.7  P:   - replete Mg - f/u BMP  GASTROINTESTINAL A:   - hx of GERD - hx of [name] cancer-->s/p of colostomy, no signs of infection around the colostomy site.  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  P:   - IV Pepcid - If not extubated today then will need TF for AM.  HEMATOLOGIC A:   - Mild leukocytosis with a WBC 11.7--> likely secondary to seizure. - No signs of infection P:   INFECTIOUS A:   - No  signs of infection, no fever.  - CXR without infiltration  P:   - blood culture x 2 - UA and UC - f/u CXR  ENDOCRINE A:    -  No history of diabetes mellitus, no history of thyroid dysfunction P:   - Monitor CBG q4h, goal of CBG 140 to 180  NEUROLOGIC A:  - hx of seizure, on Keppra 500 mg bid a at home-->partial status epilepticus on admission - Urology was consulted, Dr. Leonel Ramsay evaluated the patient-->concerned for possible subclinical status epilepticus.  - Breakthrough seizure likely to be multifactorial per Dr. Leonel Ramsay, including recent use of ciprofloxacin which can lower threshold of seizure. - EtOH negative - CT-head: 1. No acute intracranial process, with large remote right MCA territory infarct  P: - Versed / Fentanyl gtt to goal RASS 0 to -1 - Dilantin 100 mg tid IV - continue home dose of Keppra 500 mg bid IV - pending EEG - check UDS   Michail Jewels, MD Internal Medicine Teaching Service, PGY-1  Note above edited in full.  CC time 35 min.  Patient seen and examined, agree with above note.  I dictated the care and orders written for this patient under my direction.  Rush Farmer, MD [phone]

## 2013-10-12 NOTE — Progress Notes (Signed)
Stat EEG/ LTVM initiated in ED, moved w/ pt to 3100.

## 2013-10-12 NOTE — ED Provider Notes (Signed)
CSN: [id number]     Arrival date & time 10/11/13  2219 History   First MD Initiated Contact with Patient 10/11/13 2236     Chief Complaint  Patient presents with  . Seizures     (Consider location/radiation/quality/duration/timing/severity/associated sxs/prior Treatment) HPI  The following history is obtained from paramedics, as well as chart review, as the patient is unable to provide history due to seizure activity:  This is a 73 y.o. male with PMH of colon cancer status post colectomy, with stoma in place, bladder cancer status post surgery, with stoma in place, presenting as could stroke with seizure.  Time of onset 2 hours before presentation. The convulsions were located on left upper and left lower extremities. They were persistent, worsening. Unknown if patient suffered proceeding symptoms. Unknown whether patient has had new recent ingestion, exposures, trauma.   Past Medical History  Diagnosis Date  . Cancer 2002    colon  . Stroke    Past Surgical History  Procedure Laterality Date  . Colon surgery    . Coronary artery bypass graft     Family History  Problem Relation Age of Onset  . Heart disease Mother   . COPD Father    History  Substance Use Topics  . Smoking status: Former Smoker -- 1.00 packs/day    Types: Cigarettes    Start date: 12/26/1958    Quit date: 12/26/1995  . Smokeless tobacco: Not on file  . Alcohol Use: No     Comment: rare    Review of Systems  Unable to perform ROS: Mental status change      Allergies  Review of patient's allergies indicates no known allergies.  Home Medications   Prior to Admission medications   Medication Sig Start Date End Date Taking? Authorizing Provider  carvedilol (COREG) 12.5 MG tablet Take 12.5 mg by mouth 2 (two) times daily with a meal.    Historical Provider, MD  cholecalciferol (VITAMIN D) 1000 UNITS tablet Take 2,000 Units by mouth daily.     Historical Provider, MD  diltiazem (CARTIA XT) 120 MG  24 hr capsule Take 120 mg by mouth daily.    Historical Provider, MD  DULoxetine (CYMBALTA) 60 MG capsule Take 1 capsule (60 mg total) by mouth daily. 09/25/13   Lysbeth Penner, FNP  ENSURE PLUS (ENSURE PLUS) LIQD Take 237 mLs by mouth 3 (three) times daily between meals. 09/18/13   Lysbeth Penner, FNP  levETIRAcetam (KEPPRA) 500 MG tablet Take 1 tablet (500 mg total) by mouth 2 (two) times daily. 08/21/13   Lysbeth Penner, FNP  lisinopril (PRINIVIL,ZESTRIL) 2.5 MG tablet Take 2.5 mg by mouth daily.    Historical Provider, MD  Multiple Vitamins-Iron (MULTIVITAMINS WITH IRON) TABS tablet Take 1 tablet by mouth daily.    Historical Provider, MD  oxycodone (OXY-IR) 5 MG capsule Take one po q 4 hours prn breakthrough pain 09/05/13   Lysbeth Penner, FNP  OxyCODONE (OXYCONTIN) 40 mg T12A 12 hr tablet Take 1 tablet (40 mg total) by mouth every 12 (twelve) hours. 09/25/13   Lysbeth Penner, FNP  pantoprazole (PROTONIX) 40 MG tablet Take 1 tablet (40 mg total) by mouth 2 (two) times daily. 08/21/13   Lysbeth Penner, FNP  pravastatin (PRAVACHOL) 40 MG tablet Take 1 tablet (40 mg total) by mouth daily. 08/21/13   Lysbeth Penner, FNP  warfarin (COUMADIN) 2 MG tablet Take 1 tablet (2 mg total) by mouth daily. 09/18/13   Orson Ape  [place], FNP   BP 168/94  Pulse 83  Resp 16  SpO2 93% Physical Exam  Constitutional:  Thin  HENT:  Head: Normocephalic and atraumatic.  Mouth/Throat: No oropharyngeal exudate.  Eyes: Conjunctivae are normal. No scleral icterus.  Neck: No thyromegaly present.  Cardiovascular: Regular rhythm and normal heart sounds.  Exam reveals no gallop and no friction rub.   Tachycardic  Pulmonary/Chest: Effort normal and breath sounds normal. No stridor. No respiratory distress. He has no wheezes. He has no rales. He exhibits no tenderness.  Abdominal: Soft. He exhibits no distension and no mass. There is no rebound and no guarding.  Musculoskeletal: Normal range of motion. He exhibits no  edema.  Neurological:  Patient's head, eyes are deviated to the left. He is displaying constant, rhythmic tonic-clonic movement of the left upper and left lower extremities  Skin: Skin is warm. He is diaphoretic.    ED Course  INTUBATION Date/Time: 10/12/2013 1:50 AM Performed by: Doy Hutching Authorized by: Doy Hutching Consent: The procedure was performed in an emergent situation. Relevant documents: relevant documents present and verified Test results: test results available and properly labeled Imaging studies: imaging studies available Required items: required blood products, implants, devices, and special equipment available Patient identity confirmed: arm band Time out: Immediately prior to procedure a "time out" was called to verify the correct patient, procedure, equipment, support staff and site/side marked as required. Indications: airway protection Intubation method: direct Patient status: paralyzed (RSI) Sedatives: etomidate Paralytic: rocuronium Laryngoscope size: Mac 4 Tube size: 7.5 mm Tube type: cuffed Number of attempts: 1 Cricoid pressure: no Cords visualized: yes Post-procedure assessment: chest rise and CO2 detector Breath sounds: equal Cuff inflated: yes ETT to teeth: 22 cm Tube secured with: ETT holder Chest x-ray interpreted by me and radiologist. Chest x-ray findings: endotracheal tube in appropriate position Patient tolerance: Patient tolerated the procedure well with no immediate complications.   (including critical care time) Labs Review Labs Reviewed  COMPREHENSIVE METABOLIC PANEL - Abnormal; Notable for the following:    Glucose, Bld 127 (*)    Creatinine, Ser 1.60 (*)    GFR calc non Af Amer 42 (*)    GFR calc Af Amer 48 (*)    All other components within normal limits  CBC - Abnormal; Notable for the following:    WBC 11.7 (*)    RBC 4.10 (*)    Hemoglobin 12.0 (*)    HCT 35.5 (*)    RDW 17.4 (*)    All other components  within normal limits  CBG MONITORING, ED - Abnormal; Notable for the following:    Glucose-Capillary 111 (*)    All other components within normal limits  CBG MONITORING, ED - Abnormal; Notable for the following:    Glucose-Capillary 128 (*)    All other components within normal limits  I-STAT ARTERIAL BLOOD GAS, ED - Abnormal; Notable for the following:    pH, Arterial 7.184 (*)    pCO2 arterial 51.4 (*)    pO2, Arterial 293.0 (*)    Bicarbonate 19.3 (*)    Acid-base deficit 9.0 (*)    All other components within normal limits  I-STAT ARTERIAL BLOOD GAS, ED - Abnormal; Notable for the following:    pH, Arterial 7.249 (*)    pO2, Arterial 68.0 (*)    Bicarbonate 16.9 (*)    Acid-base deficit 10.0 (*)    All other components within normal limits  CULTURE, BLOOD (ROUTINE X 2)  CULTURE, BLOOD (ROUTINE X  2)  MAGNESIUM  PHOSPHORUS  ETHANOL  POTASSIUM  URINE RAPID DRUG SCREEN (HOSP PERFORMED)  URINALYSIS, DIPSTICK ONLY  LEVETIRACETAM LEVEL  LACTIC ACID, PLASMA  TROPONIN I  BASIC METABOLIC PANEL  MAGNESIUM  PRO B NATRIURETIC PEPTIDE  CK  BLOOD GAS, ARTERIAL  URINALYSIS, ROUTINE W REFLEX MICROSCOPIC  PROTIME-INR  I-STAT TROPOININ, ED  I-STAT CHEM 8, ED    Imaging Review Ct Head Wo Contrast  10/11/2013   CLINICAL DATA:  Seizure  EXAM: CT HEAD WITHOUT CONTRAST  TECHNIQUE: Contiguous axial images were obtained from the base of the skull through the vertex without intravenous contrast.  COMPARISON:  Prior CT from 10/03/2013  FINDINGS: Extensive encephalomalacia throughout the right MCA territory again seen, compatible with remote right MCA territory infarct. Apparent differences in area of the encephalomalacia as compared to the recent exam likely related to angulation of the gantry. Generalized atrophy again noted. No acute intracranial hemorrhage or infarct. No mass lesion or midline shift. No extra-axial fluid collection. Atherosclerotic calcifications within the carotid siphons  and bilateral vertebral arteries noted.  Calvarium is normal. Scalp soft tissues unremarkable. Orbits are within normal limits. The paranasal sinuses and mastoid air cells are well pneumatized and free of fluid.  IMPRESSION: 1. No acute intracranial process. 2. Large remote right MCA territory infarct.   Electronically Signed   By: Jeannine Boga M.D.   On: 10/11/2013 23:15   Dg Chest Port 1 View  10/12/2013   CLINICAL DATA:  Assess endotracheal tube position  EXAM: PORTABLE CHEST - 1 VIEW  COMPARISON:  DG CHEST 1V PORT dated 08/27/2013  FINDINGS: The endotracheal tube tip lies approximately 3 cm above the crotch of the carina. The lungs are well-expanded. The interstitial markings are increased bilaterally. There is no pleural effusion or pneumothorax. The cardiopericardial silhouette is normal in size. The pulmonary vascularity is indistinct. There are 5 sternal wires visible. The observed portions of the bony thorax appear normal.  IMPRESSION: 1. The endotracheal tube appears be in reasonable position 3 cm above the crotch of the carina. 2. The pulmonary interstitial markings are increased. This may reflect pulmonary interstitial edema of cardiac or noncardiac cause. There is no alveolar pneumonia.   Electronically Signed   By: David  Martinique   On: 10/12/2013 01:25     EKG Interpretation None      MDM   Final diagnoses:  None    This is a 73 y.o. male with PMH of colon cancer status post colectomy, with stoma in place, bladder cancer status post surgery, with stoma in place, presenting as could stroke with seizure.   I was called to the entry way of the department to evaluate the patient's airway before he went to the scanner. On my presentation to the patient's side, he is deviating to the left, with rhythmic convulsive activity of the left lower extremity as well as as left upper extremity. Blood glucose in transit normal. He was able to tell me his name; however, I believe that at that  time he was suffering from a simple partial seizure. I immediately ordered 2 mg of Ativan. We went to the CT scanner. Within the span of about 1-2 minutes, seizure converted to generalized complex seizure. He was not responsive. Both sides of his body were convulsing. 2 mg Ativan were administered.  Patient preserved good respiratory effort. He remained saturating 100% on nonrebreather mask. CT scan was completed. At that time he had not broken from seizure activity. We administer 2 more  milligrams of Ativan. Additional peripheral IV access was obtained. Blood was drawn, he was moved to a room. EKG revealed tachycardia with PVCs.  CT scan is without acute abnormalities. Initial i-STAT potassium was high. No EKG changes to coincide with this value. Repeat potassium is normal, as expected. No electrolyte abnormalities to explain seizure activity. Patient was recently put on ciprofloxacin.   Date: 10/12/2013  Rate: 114  Rhythm: Sinus versus atrial tachycardia  QRS Axis: normal  Intervals: Borderline QT interval  ST/T Wave abnormalities: nonspecific T wave changes  Conduction Disutrbances:none  Narrative Interpretation: Tachycardic with borderline QT interval with nonspecific T wave changes, PVCs  Old EKG Reviewed: none available   Patient remains with seizure activity, so an additional 2 mg of Ativan was administered. Patient was observed for some time after administration of Ativan with no return to baseline mental status. EEG was started, with questionable seizure activity. GCS is 3. I contacted critical care, as I deemed this level of care appropriate. I discussed the possibility of intubating this patient, as the patient has a GCS of 3 currently, but is maintaining good oxygenation. They recommended the patient. I agree with this.  Please refer to procedures area for further details of intubation. Shortly, procedure was performed without palpitation.  Patient is being admitted to the ICU, with  neurology continuing EEG.  I have discussed case and care has been guided by my attending physician, Dr. Jeneen Rinks.  Doy Hutching, MD 10/12/13 [phone]

## 2013-10-12 NOTE — Progress Notes (Signed)
Pharmacy Consult for [facility] Indication: pneumonia  No Known Allergies  Patient Measurements: Height: 6\' 2"  (188 cm) Weight: 162 lb 0.6 oz (73.5 kg) IBW/kg (Calculated) : 82.2  Vital Signs: Temp: 97.9 F (36.6 C) (04/30 0500) Temp src: Axillary (04/30 0500) BP: 110/52 mmHg (04/30 1210) Pulse Rate: 73 (04/30 1210) Intake/Output from previous day: 04/29 0701 - 04/30 0700 In: 1412.9 [I.V.:412.9; IV Piggyback:1000] Out: 300 [Urine:300] Intake/Output from this shift: Total I/O In: 114.6 [I.V.:64.6; IV Piggyback:50] Out: 100 [Urine:100]  Labs:  Recent Labs  10/11/13 2252 10/12/13 0348 10/12/13 0842  WBC 11.7*  --   --   HGB 12.0*  --   --   PLT 321  --   --   CREATININE 1.60* 1.40* 1.42*   Estimated Creatinine Clearance: 49.6 ml/min (by C-G formula based on Cr of 1.42). No results found for this basename: VANCOTROUGH, Corlis Leak, VANCORANDOM, [place], GENTPEAK, GENTRANDOM, TOBRATROUGH, TOBRAPEAK, TOBRARND, AMIKACINPEAK, AMIKACINTROU, AMIKACIN,  in the last 72 hours   Microbiology: Recent Results (from the past 720 hour(s))  MRSA PCR SCREENING     Status: Abnormal   Collection Time    10/12/13  2:33 AM      Result Value Ref Range Status   MRSA by PCR POSITIVE (*) NEGATIVE Final   Comment:            The GeneXpert MRSA Assay (FDA     approved for NASAL specimens     only), is one component of a     comprehensive MRSA colonization     surveillance program. It is not     intended to diagnose MRSA     infection nor to guide or     monitor treatment for     MRSA infections.     RESULT CALLED TO, READ BACK BY AND VERIFIED WITH:     Nelta Numbers RN [id number] GREEN R    Anti-infectives   None      Assessment: 73 y.o. 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 Pharmacy is consulted to start [facility] empirically for HCAP. Wbc 11.7, CXR without infiltration, LA  3.1, procalcitonin pending. Pt. 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 crcl ~ 50 ml/min,   Fortaz 4/30 >>   4/30 Blood x 2 4/30 Urine 4/30 MRSA PCR pos  Goal of Therapy:  Appropriate antibiotic dosing.  Plan:  - Ceftazidime 1g IV Q 12 hrs - f/u renal function and cultures  Maryanna Shape, PharmD, BCPS  Clinical Pharmacist  Pager: [phone]   10/12/2013,2:05 PM

## 2013-10-12 NOTE — Procedures (Signed)
Central Venous Catheter Insertion Procedure Note Jakyren Fluegge [id number] January 26, 1941  Procedure: Insertion of Central Venous Catheter Indications: Assessment of intravascular volume, Drug and/or fluid administration and Frequent blood sampling  Procedure Details Consent: Risks of procedure as well as the alternatives and risks of each were explained to the (patient/caregiver).  Consent for procedure obtained. Time Out: Verified patient identification, verified procedure, site/side was marked, verified correct patient position, special equipment/implants available, medications/allergies/relevent history reviewed, required imaging and test results available.  Performed  Maximum sterile technique was used including antiseptics, cap, gloves, gown, hand hygiene, mask and sheet. Skin prep: Chlorhexidine; local anesthetic administered A antimicrobial bonded/coated triple lumen catheter was placed in the left internal jugular vein using the Seldinger technique.  Evaluation Blood flow good Complications: No apparent complications Patient did tolerate procedure well. Chest X-ray ordered to verify placement.  CXR: pending.  Raylene Miyamoto 10/12/2013, 12:05 PM  [place]  Lavon Paganini. Titus Mould, MD, [place] Pgr: [facility]

## 2013-10-12 NOTE — Progress Notes (Signed)
[facility] Progress Note Patient Name: Tolbert Matheson DOB: [birth year] MRN: [id number]  Date of Service  10/12/2013   HPI/Events of Note   Case reviewed with resident PCCM staff to see this AM   eICU Interventions  Vent orders placed   Intervention Category Major Interventions: Respiratory failure - evaluation and management  Juanito Doom 10/12/2013, 2:48 AM

## 2013-10-12 NOTE — Consult Note (Signed)
WOC ostomy consult note Pt admitted for seizures Stoma type/location:  Colostomy (2001)-LLQ Urostomy (20020-RLQ Stomal assessment/size: both pouch intact with good seal, will plan to change tomorrow based on status today Peristomal assessment: NA Treatment options for stomal/peristomal skin: NA Output clear, yellow urine in the urostomy pouch, the staff have the BSD attached to the pouch without an adapter, I have ordered one to the bedside for their use. Liquid brown effluent and flatus Ostomy pouching: 2pc. In place at each site, supplies ordered to the beside. Similar products for use by [facility] nurse or bedside nurse if needed. Pt intubated and unresponsive on the vent and no family at bedside to question who cares for stomas at home.  Will attempt to assess this once off vent or family present.   WOC will remain available for ostomy care and assistance [place] RN,CWOCN [phone]

## 2013-10-12 NOTE — Progress Notes (Signed)
CRITICAL VALUE ALERT  Critical value received: Trop 0.43 Date of notification: 04/30 Time of notification:  0518 Critical value read back:yes  Nurse who received alert:  Simeon Craft.  MD notified (1st page): Elink  Time of first page:  0518  MD notified (2nd page):  Time of second page:  Responding MD:  Warren Lacy  Time MD responded:  [phone]

## 2013-10-12 NOTE — Progress Notes (Signed)
ANTICOAGULATION CONSULT NOTE - Initial Consult  Pharmacy Consult for Coumadin Indication: atrial fibrillation  No Known Allergies   Vital Signs: Temp: 97.9 F (36.6 C) (04/30 0500) Temp src: Axillary (04/30 0500) BP: 96/54 mmHg (04/30 0809) Pulse Rate: 70 (04/30 0809)  Labs:  Recent Labs  10/11/13 2252 10/12/13 0348  HGB 12.0*  --   HCT 35.5*  --   PLT 321  --   LABPROT  --  20.6*  INR  --  1.83*  CREATININE 1.60* 1.40*  CKTOTAL  --  139  TROPONINI  --  0.43*     Medical History: Past Medical History  Diagnosis Date  . Cancer 2002    colon  . Stroke     Assessment: 73yo male was having "twitching" episode similar to prior CVA, was in ED when grand mal sz started, CT of head shows no acute process, sz stopped w/ Ativan and Dilantin, to be admitted for further w/u; to continue Coumadin for PAF and prior CVA, had outpt anticoag visit 4/28 where INR was low and was to load with Coumadin 4mg  x2d then begin 2mg  TTSS and 4mg  MWF.   INR 1.83, noted also on sq heparin.  Goal of Therapy:  INR 2-3   Plan:  Coumadin 4mg  po x1 Daily PT/ INR D/c sq heparin when INR > 2  Maryanna Shape, PharmD, BCPS  Clinical Pharmacist  Pager: [phone]   10/12/2013,9:52 AM

## 2013-10-12 NOTE — Progress Notes (Signed)
eLink Physician-Brief Progress Note Patient Name: Akram Kissick DOB: 1940/10/23 MRN: [id number]  Date of Service  10/12/2013   HPI/Events of Note  Now on prn sedation: he is agitated but follownig commands per RN and is at risk for self extubation   eICU Interventions  Add diprivan with fluid bolus Possible extubation 10/13/13 AM   Intervention Category Major Interventions: Delirium, psychosis, severe agitation - evaluation and management  Tenia Goh 10/12/2013, 10:18 PM

## 2013-10-12 NOTE — Progress Notes (Signed)
[facility] Progress Note Patient Name: [name] [name] DOB: 1941-01-13 MRN: [id number]  Date of Service  10/12/2013   HPI/Events of Note   Mild hypotension  eICU Interventions  Bolus 1 L NS now   Intervention Category Major Interventions: Hypotension - evaluation and management  Juanito Doom 10/12/2013, 5:06 AM

## 2013-10-12 NOTE — Progress Notes (Signed)
LTM EEG discontinued.  

## 2013-10-12 NOTE — Progress Notes (Signed)
UR completed.  Bettyann Birchler, RN BSN M[facility] CCM Trauma/Neuro ICU Case Manager [phone]  

## 2013-10-12 NOTE — Progress Notes (Addendum)
EEG shows PLEDs-proper in the right hemisphere with a frequency of 0.5 - 1 Hz. This is a pattern that though it can be ictal in rare circumstances is more likely a finding associated with structural abnormality. This pattern can be ictal, interictal, or post-ictal which is my suspicion in his case. Continue keppra at 1gm BID and phenytoin at 100mg  TID.   Roland Rack, MD [facility] [phone]  If 7pm- 7am, please page neurology on call as listed in [place].

## 2013-10-12 NOTE — ED Provider Notes (Signed)
Pt seen and evaluated on arrival by myself, and Dr. Jodi Mourning.  The patient immediately had seizure with left upper extremity clonic activity, and leftward eye deviation. Blood sugar normal. CT scan shows no acute findings. Patient loaded with phenytoin. Takes Keppra chronically. Given multiple doses IV benzodiazepines. This is seen in conjunction with Dr. Leonel Ramsay.  Patient rather sedated from multiple medications. Continues to oxygenate well. GCS is 3. Patient intubated without difficulty or hypoxemia. Consultation with pulmonary critical care medicine. Patient will be going to the ICU for continued care.  CRITICAL CARE Performed by: Tanna Furry   Total critical care time: 30 min 00:50 Start 01:20 End  Critical care time was exclusive of separately billable procedures and treating other patients.  Critical care was necessary to treat or prevent imminent or life-threatening deterioration.  Critical care was time spent personally by me on the following activities: development of treatment plan with patient and/or surrogate as well as nursing, discussions with consultants, evaluation of patient's response to treatment, examination of patient, obtaining history from patient or surrogate, ordering and performing treatments and interventions, ordering and review of laboratory studies, ordering and review of radiographic studies, pulse oximetry and re-evaluation of patient's condition.   Tanna Furry, MD 10/12/13 [phone]

## 2013-10-12 NOTE — Procedures (Signed)
History: 73 year old male who presented in status epilepticus.   This continues EEG was recorded from 4/30 12:41 AM until 4/30 9:04 AM.  Sedation: midazolam  Technique: This is a 17 channel routine scalp EEG performed at the bedside with bipolar and monopolar montages arranged in accordance to the international 10/20 system of electrode placement. One channel was dedicated to EKG recording.    Background: The background consists of low voltage irregular delta activity. Occasional runs of beta activity resembling sleep structures are seen better on the left than right, though present to some degree in the right posterior as well.   From the onset of recording there are lateralized periodic discharges with a delta morphology occurring at a frequency of 0.5-1 Hz. There is a visible dipole with positive component in the right posterior and negative component the frontal region. Towards the end of the recording there are brief periods of relative arousal and stimulation though no clear posterior dominant rhythm is observed.   Photic stimulation: Physiologic driving is not performed  EEG Abnormalities: 1) lateralized periodic discharges in the right hemisphere with delta morphology. 2) regular delta activity  Clinical Interpretation: This EEG recorded lateralized periodic discharges. This finding is typically associated with structural abnormal abnormalities and can be interictal, or postictal in nature. There is no associated faster activity, evolution, or other concerning morphological characteristics to suggest an ictal nature. This does represent a potential area of epileptogenicity but no definite seizure was recorded.   Roland Rack, MD [facility] [phone]  If 7pm- 7am, please page neurology on call as listed in [place].

## 2013-10-12 NOTE — Progress Notes (Signed)
**Note De-identified  Obfuscation** RT note: sputum collected and sent to lab. 

## 2013-10-12 NOTE — H&P (Addendum)
PULMONARY / CRITICAL CARE MEDICINE   Name: Parris Cudworth MRN: [id number] DOB: Nov 03, 1940    ADMISSION DATE:  10/11/2013 CONSULTATION DATE:  10/12/2013  REFERRING MD :  ED PRIMARY SERVICE: PCCM  CHIEF COMPLAINT:  Seizures  BRIEF PATIENT DESCRIPTION: Margie Brink is a 73 y.o. 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     SIGNIFICANT EVENTS / STUDIES:  4/30 CT head: No acute process; large remote R MCA infarct 4/30 Admit with seizures 4/30 pCXR: Interstitial edema   LINES / TUBES: PIV 4/29>> OETT 4/30>>  CULTURES: MRSA PCR 4/29>>  BC 4/30>> UA 4/30>> UC 4/30>>  ANTIBIOTICS:  Overnight: drop in BP , bolus given  VITAL SIGNS: Temp:  [93.8 F (34.3 C)-97.9 F (36.6 C)] 97.9 F (36.6 C) (04/30 0500) Pulse Rate:  [70-88] 70 (04/30 0809) Resp:  [11-18] 16 (04/30 0700) BP: (88-168)/(45-96) 96/54 mmHg (04/30 0809) SpO2:  [93 %-100 %] 100 % (04/30 0700) FiO2 (%):  [40 %-50 %] 40 % (04/30 0740) Weight:  [73.5 kg (162 lb 0.6 oz)] 73.5 kg (162 lb 0.6 oz) (04/30 0241)  HEMODYNAMICS:   VENTILATOR SETTINGS: Vent Mode:  [-] PRVC FiO2 (%):  [40 %-50 %] 40 % Set Rate:  [16 bmp] 16 bmp Vt Set:  [460 mL-650 mL] 650 mL PEEP:  [5 cmH20] 5 cmH20 Plateau Pressure:  [13 cmH20-19 cmH20] 18 cmH20  INTAKE / OUTPUT: Intake/Output     04/29 0701 - 04/30 0700 04/30 0701 - 05/01 0700   I.V. (mL/kg) 412.9 (5.6)    IV Piggyback 1000    Total Intake(mL/kg) 1412.9 (19.2)    Urine (mL/kg/hr) 300    Total Output 300     Net +1112.9           PHYSICAL EXAMINATION: General:  Intubated.  Neuro:  WD to pain right, just started wua HEENT:  3 mm pupils  Cardiovascular: s1s2  tachycardic, regular rhythm  Chest/Lungs:  coarse Abdomen:  +bs, soft, non-distended; urostomy and colostomy present Musculoskeletal:  no obvious deformities Skin:  warm, dry; without  edema  LABS:  CBC  Recent Labs Lab 10/11/13 2252  WBC 11.7*  HGB 12.0*  HCT 35.5*  PLT 321   Coag's  Recent Labs Lab 10/10/13 0908 10/12/13 0348  INR 1.6 1.83*   BMET  Recent Labs Lab 10/11/13 2252 10/11/13 2337 10/12/13 0348  NA 140  --  138  K 4.2 4.1 5.1  CL 106  --  106  CO2 19  --  14*  BUN 20  --  20  CREATININE 1.60*  --  1.40*  GLUCOSE 127*  --  143*   Electrolytes  Recent Labs Lab 10/11/13 2252 10/12/13 0348  CALCIUM 9.3 8.7  MG 1.7 2.0  PHOS 3.9  --    Sepsis Markers  Recent Labs Lab 10/12/13 0150  LATICACIDVEN 1.8   ABG  Recent Labs Lab 10/12/13 0037 10/12/13 0216  PHART 7.184* 7.249*  PCO2ART 51.4* 38.6  PO2ART 293.0* 68.0*   Liver Enzymes  Recent Labs Lab 10/11/13 2252  AST 17  ALT 8  ALKPHOS 52  BILITOT 0.4  ALBUMIN 3.7   Cardiac Enzymes  Recent Labs Lab 10/12/13 0348  TROPONINI 0.43*  PROBNP 2369.0*   Glucose  Recent Labs Lab 10/11/13 2227 10/11/13 2323  GLUCAP 111* 128*    Imaging Ct Head Wo Contrast  10/11/2013   CLINICAL DATA:  Seizure  EXAM:  CT HEAD WITHOUT CONTRAST  TECHNIQUE: Contiguous axial images were obtained from the base of the skull through the vertex without intravenous contrast.  COMPARISON:  Prior CT from 10/03/2013  FINDINGS: Extensive encephalomalacia throughout the right MCA territory again seen, compatible with remote right MCA territory infarct. Apparent differences in area of the encephalomalacia as compared to the recent exam likely related to angulation of the gantry. Generalized atrophy again noted. No acute intracranial hemorrhage or infarct. No mass lesion or midline shift. No extra-axial fluid collection. Atherosclerotic calcifications within the carotid siphons and bilateral vertebral arteries noted.  Calvarium is normal. Scalp soft tissues unremarkable. Orbits are within normal limits. The paranasal sinuses and mastoid air cells are well pneumatized and free of fluid.   IMPRESSION: 1. No acute intracranial process. 2. Large remote right MCA territory infarct.   Electronically Signed   By: Jeannine Boga M.D.   On: 10/11/2013 23:15   Dg Chest Port 1 View  10/12/2013   CLINICAL DATA:  Assess endotracheal tube position  EXAM: PORTABLE CHEST - 1 VIEW  COMPARISON:  DG CHEST 1V PORT dated 08/27/2013  FINDINGS: The endotracheal tube tip lies approximately 3 cm above the crotch of the carina. The lungs are well-expanded. The interstitial markings are increased bilaterally. There is no pleural effusion or pneumothorax. The cardiopericardial silhouette is normal in size. The pulmonary vascularity is indistinct. There are 5 sternal wires visible. The observed portions of the bony thorax appear normal.  IMPRESSION: 1. The endotracheal tube appears be in reasonable position 3 cm above the crotch of the carina. 2. The pulmonary interstitial markings are increased. This may reflect pulmonary interstitial edema of cardiac or noncardiac cause. There is no alveolar pneumonia.   Electronically Signed   By: David  Martinique   On: 10/12/2013 01:25   ASSESSMENT / PLAN:  PULMONARY A:  Acute resp failure, uncompensated met acidosis P: - Increase rate and repeat abg -hope as lactic resolved from seizures will compensate easier anc correct ph -may in fact need higher MV -pcx in am  -no weaning with concern status -neg balance  CARDIOVASCULAR A:  - hx of CAD, s/p CABG - hx of PAF per neurology's note 08/22/13. On Coumadin, but INR subtherapeutic - hx of stroke, MCA territory. - HTN h/o -drop in BP  P:  - hold home Cardizem, lisinopril due to soft blood pressure on admission secondary to sedative medications - dc coreg 12.36m bid - continue coumadin - place line assess cvp -may need levophed to MAP goal 65 -cortisol -tsh -if to fib, control rate  RENAL A:   - hx of bladder cancer-->s/p of urostomy-->no signs of infection around urostomy - Bladder involvement from  metastasized colon cancer-->s/p of cystectomy and urostomy tube. - hx of CKD-III with BL cre 1.33 to 2.34. creatinine slightly elevated from 1.33 on 3/24-->1.66, but seems be to in his baseline range - HypoMG, Mg 1.7 -AG acidosis, lactic acid presume was higher prior drawing  P:   - lasix when BP allows to neg balance -need cvp - f/u BMP in pm and am -send serum osm Assess trend lacitc  GASTROINTESTINAL A:   - hx of GERD - hx of colon cancer-->s/p of colostomy, no signs of infection around the colostomy site.  - Colon cancer with resection (2001), metastatic colon cancer (2002) involving bladder with cystectomy and urostomy tubes and colostomy.  P:   - IV Pepcid - start TF -lft in am   HEMATOLOGIC A:   - Mild  leukocytosis with a WBC 11.7--> likely secondary to seizure. - No signs of infection P:   INFECTIOUS A:   - No signs of infection, no fever.  - CXR without infiltration  P:   - f/u CXR  ENDOCRINE A:    - No history of diabetes mellitus, no history of thyroid dysfunction P:   - Monitor CBG q4h, goal of CBG 140 to 180  NEUROLOGIC A:  - hx of seizure, on Keppra 500 mg bid a at home-->r/o subclinical status epilepticus.  - EtOH negative - CT-head: 1. No acute intracranial process, with large remote right MCA territory infarct  P: - Versed / Fentanyl gtt to goal RASS 0 to -1 - Dilantin 100 mg tid IV, lft in am  - continue home dose of Keppra 500 mg bid IV - consider MRI brain, extension? subther on coumadin -asa  CC time 50 min. Family updated Global: may need pressors, abg, MV increase 20, tf start, cbg, no weaning  Raylene Miyamoto, MD [phone]

## 2013-10-12 NOTE — Progress Notes (Signed)
INITIAL NUTRITION ASSESSMENT  DOCUMENTATION CODES Per approved criteria  -Non-severe (moderate) malnutrition in the context of chronic illness   Pt meets criteria for non-severe (moderate) MALNUTRITION in the context of chronic illness as evidenced by mild depletion of muscle mass and body fat.   INTERVENTION:  Initiate Vital AF 1.2 at 20 ml/hr increasing by 10 ml q 4 hours to reach goal rate of 60 ml/hr.   Tube feeding regimen providing 1728 kcal, 108 grams of protein and 1168 ml free water. Meets 98% of estimated energy needs and 100% of estimated protein needs.   NUTRITION DIAGNOSIS: Inadequate oral intake related to inability to eat as evidenced by NPO status.   Goal: Meet >/= 90% of estimated nutrition needs   Monitor:  TF initiation/tolerance, weight trend, labs   Reason for Assessment: MD Consult for initiation and management of tube feeding   73 y.o. male  Admitting Dx: Seizures   ASSESSMENT: Patient is a 73 y.o. 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   Pt's daughter in law provided history as pt was unresponsive. She states that pt does not have a good appetite when he comes home from the hospital, and has been in hospital several times this year. She explains that he usually has breakfast (yogurt or donut) and lunch and dinner (meat and vegetables). Did not give very detailed history.   Daughter-in-law reports a usual body weight of 200 lbs when pt was healthy. Reported weight loss of 40-50 lbs since his strokes last year. Son reports that pt was 183 lbs last summer and has slowly lost weight (11%) to his current weight of 162 lbs. Could not confirm this in chart, as pt does not have weight history past March 2015. She states that pt has maintained his weight of 160-170 lbs since march.   Per chart history, pt has recent history of c-diff (3/15), recently separated  from wife, 6 hospital stays in 2015, and has chronic pain due to treatment.   Suspect patient has poor PO intake at home due to chronic health issues.   Nutrition Focused Physical Exam:  Subcutaneous Fat:  Orbital Region: wnl Upper Arm Region: wnl Thoracic and Lumbar Region: mild depletion  Muscle:  Temple Region: mild depletion Clavicle Bone Region: mild depletion Clavicle and Acromion Bone Region: mild depletion Scapular Bone Region: n/a Dorsal Hand: n/a Patellar Region: mild depletion Anterior Thigh Region: mild depletion  Posterior Calf Region: mild depletion  Edema: none   Patient is currently intubated on ventilator support.  MV: 11.4 Temp (24hrs), Avg:95.9 F (35.5 C), Min:93.8 F (34.3 C), Max:97.9 F (36.6 C)   Height: Ht Readings from Last 1 Encounters:  10/12/13 6\' 2"  (1.88 m)    Weight: Wt Readings from Last 1 Encounters:  10/12/13 162 lb 0.6 oz (73.5 kg)    Ideal Body Weight: 80.9 kg  % Ideal Body Weight: 91%   Wt Readings from Last 10 Encounters:  10/12/13 162 lb 0.6 oz (73.5 kg)  09/25/13 165 lb (74.844 kg)  09/18/13 164 lb 9.6 oz (74.662 kg)  09/05/13 160 lb 9.6 oz (72.848 kg)  08/22/13 167 lb 12.8 oz (76.114 kg)  08/21/13 165 lb 3.2 oz (74.934 kg)    Usual Body Weight: 160 - 165 lbs  % Usual Body Weight: 100%   BMI:  Body mass index is 20.8 kg/(m^2).  Estimated Nutritional Needs: Kcal: 1756  Protein: 105 -  120 g Fluid: >/= 1.7 L/day   Skin: Left ankle wound   Diet Order: NPO  EDUCATION NEEDS: -Education not appropriate at this time   Intake/Output Summary (Last 24 hours) at 10/12/13 0911 Last data filed at 10/12/13 0600  Gross per 24 hour  Intake 1412.85 ml  Output    300 ml  Net 1112.85 ml    Last BM: Colostomy LUQ  - no output so far  Labs:   Recent Labs Lab 10/11/13 2252 10/11/13 2337 10/12/13 0348  NA 140  --  138  K 4.2 4.1 5.1  CL 106  --  106  CO2 19  --  14*  BUN 20  --  20  CREATININE 1.60*  --   1.40*  CALCIUM 9.3  --  8.7  MG 1.7  --  2.0  PHOS 3.9  --   --   GLUCOSE 127*  --  143*    CBG (last 3)   Recent Labs  10/11/13 2227 10/11/13 2323  GLUCAP 111* 128*   No results found for this basename: HGBA1C    Scheduled Meds: . antiseptic oral rinse  15 mL Mouth Rinse QID  . chlorhexidine  15 mL Mouth Rinse BID  . cholecalciferol  2,000 Units Oral Daily  . famotidine (PEPCID) IV  20 mg Intravenous Q12H  . feeding supplement (VITAL HIGH PROTEIN)  1,000 mL Per Tube Q24H  . heparin  5,000 Units Subcutaneous 3 times per day  . insulin aspart  0-9 Units Subcutaneous 6 times per day  . levETIRAcetam  1,000 mg Intravenous Q12H  . lidocaine (cardiac) 100 mg/75ml      . pantoprazole (PROTONIX) IV  40 mg Intravenous Q24H  . phenytoin (DILANTIN) IV  100 mg Intravenous 3 times per day  . rocuronium      . simvastatin  20 mg Oral q1800  . succinylcholine      . Warfarin - Pharmacist Dosing Inpatient   Does not apply q1800    Continuous Infusions: . sodium chloride 10 mL/hr at 10/12/13 0630  . fentaNYL infusion INTRAVENOUS 25 mcg/hr (10/12/13 0644)  . midazolam (VERSED) infusion 1 mg/hr (10/12/13 1610)    Past Medical History  Diagnosis Date  . Cancer 2002    colon  . Stroke     Past Surgical History  Procedure Laterality Date  . Colon surgery    . Coronary artery bypass graft      Carrolyn Leigh, BS Dietetic Intern Pager: [phone]  Intern note/chart reviewed. Revisions made.  [place], [place], [place] Pager [phone] After Hours Pager

## 2013-10-12 NOTE — Progress Notes (Signed)
ANTICOAGULATION CONSULT NOTE - Initial Consult  Pharmacy Consult for Coumadin Indication: atrial fibrillation  No Known Allergies   Vital Signs: BP: 119/93 mmHg (04/30 0115) Pulse Rate: 81 (04/30 0008)  Labs:  Recent Labs  10/10/13 0908 10/11/13 2252  HGB  --  12.0*  HCT  --  35.5*  PLT  --  321  INR 1.6  --   CREATININE  --  1.60*     Medical History: Past Medical History  Diagnosis Date  . Cancer 2002    colon  . Stroke     Assessment: 73yo male was having "twitching" episode similar to prior CVA, was in ED when grand mal sz started, CT of head shows no acute process, sz stopped w/ Ativan and Dilantin, to be admitted for further w/u; to continue Coumadin for PAF and prior CVA, had outpt anticoag visit 4/28 where INR was low and was to load with Coumadin 4mg  x2d then begin 2mg  TTSS and 4mg  MWF.  Goal of Therapy:  INR 2-3   Plan:  Will obtain current INR prior to dosing Coumadin.  Wynona Neat, PharmD, BCPS  10/12/2013,2:13 AM

## 2013-10-12 NOTE — Progress Notes (Signed)
[facility] Progress Note Patient Name: Neel Buffone DOB: 12/19/1940 MRN: [id number]  Date of Service  10/12/2013   HPI/Events of Note  bp soft. CVP 4  Does not need sedation gtt per RN   eICU Interventions  1L fluid bolus  Change sedation gtt to fent prn   Intervention Category Major Interventions: Hypotension - evaluation and management  Brand Males 10/12/2013, 3:31 PM

## 2013-10-13 ENCOUNTER — Inpatient Hospital Stay (HOSPITAL_COMMUNITY): Payer: Medicare Other

## 2013-10-13 DIAGNOSIS — R4182 Altered mental status, unspecified: Secondary | ICD-10-CM

## 2013-10-13 DIAGNOSIS — R569 Unspecified convulsions: Secondary | ICD-10-CM

## 2013-10-13 DIAGNOSIS — T45515A Adverse effect of anticoagulants, initial encounter: Secondary | ICD-10-CM

## 2013-10-13 DIAGNOSIS — C679 Malignant neoplasm of bladder, unspecified: Secondary | ICD-10-CM

## 2013-10-13 DIAGNOSIS — G40909 Epilepsy, unspecified, not intractable, without status epilepticus: Secondary | ICD-10-CM

## 2013-10-13 DIAGNOSIS — D689 Coagulation defect, unspecified: Secondary | ICD-10-CM

## 2013-10-13 DIAGNOSIS — R58 Hemorrhage, not elsewhere classified: Secondary | ICD-10-CM

## 2013-10-13 LAB — CBC WITH DIFFERENTIAL/PLATELET
BASOS PCT: 0 % (ref 0–1)
Basophils Absolute: 0 10*3/uL (ref 0.0–0.1)
Eosinophils Absolute: 0 10*3/uL (ref 0.0–0.7)
Eosinophils Relative: 0 % (ref 0–5)
HCT: 27 % — ABNORMAL LOW (ref 39.0–52.0)
HEMOGLOBIN: 9.3 g/dL — AB (ref 13.0–17.0)
LYMPHS ABS: 2.3 10*3/uL (ref 0.7–4.0)
LYMPHS PCT: 26 % (ref 12–46)
MCH: 29.5 pg (ref 26.0–34.0)
MCHC: 34.4 g/dL (ref 30.0–36.0)
MCV: 85.7 fL (ref 78.0–100.0)
MONOS PCT: 12 % (ref 3–12)
Monocytes Absolute: 1.1 10*3/uL — ABNORMAL HIGH (ref 0.1–1.0)
NEUTROS ABS: 5.6 10*3/uL (ref 1.7–7.7)
NEUTROS PCT: 62 % (ref 43–77)
Platelets: 227 10*3/uL (ref 150–400)
RBC: 3.15 MIL/uL — AB (ref 4.22–5.81)
RDW: 17.9 % — ABNORMAL HIGH (ref 11.5–15.5)
WBC: 9 10*3/uL (ref 4.0–10.5)

## 2013-10-13 LAB — BASIC METABOLIC PANEL
BUN: 16 mg/dL (ref 6–23)
CO2: 17 mEq/L — ABNORMAL LOW (ref 19–32)
Calcium: 7.9 mg/dL — ABNORMAL LOW (ref 8.4–10.5)
Chloride: 115 mEq/L — ABNORMAL HIGH (ref 96–112)
Creatinine, Ser: 1.34 mg/dL (ref 0.50–1.35)
GFR, EST AFRICAN AMERICAN: 60 mL/min — AB (ref >90)
GFR, EST NON AFRICAN AMERICAN: 52 mL/min — AB (ref >90)
GLUCOSE: 119 mg/dL — AB (ref 70–99)
POTASSIUM: 3.3 meq/L — AB (ref 3.7–5.3)
Sodium: 144 mEq/L (ref 137–147)

## 2013-10-13 LAB — COMPREHENSIVE METABOLIC PANEL
ALT: 7 U/L (ref 0–53)
AST: 24 U/L (ref 0–37)
Albumin: 2.6 g/dL — ABNORMAL LOW (ref 3.5–5.2)
Alkaline Phosphatase: 35 U/L — ABNORMAL LOW (ref 39–117)
BUN: 16 mg/dL (ref 6–23)
CALCIUM: 7.8 mg/dL — AB (ref 8.4–10.5)
CO2: 16 meq/L — AB (ref 19–32)
CREATININE: 1.32 mg/dL (ref 0.50–1.35)
Chloride: 113 mEq/L — ABNORMAL HIGH (ref 96–112)
GFR calc non Af Amer: 53 mL/min — ABNORMAL LOW (ref >90)
GFR, EST AFRICAN AMERICAN: 61 mL/min — AB (ref >90)
GLUCOSE: 114 mg/dL — AB (ref 70–99)
Potassium: 3.5 mEq/L — ABNORMAL LOW (ref 3.7–5.3)
Sodium: 142 mEq/L (ref 137–147)
TOTAL PROTEIN: 5.8 g/dL — AB (ref 6.0–8.3)
Total Bilirubin: <0.2 mg/dL — ABNORMAL LOW (ref 0.3–1.2)

## 2013-10-13 LAB — BLOOD GAS, ARTERIAL
Acid-base deficit: 6.7 mmol/L — ABNORMAL HIGH (ref 0.0–2.0)
Acid-base deficit: 7 mmol/L — ABNORMAL HIGH (ref 0.0–2.0)
BICARBONATE: 16.3 meq/L — AB (ref 20.0–24.0)
Bicarbonate: 16.3 mEq/L — ABNORMAL LOW (ref 20.0–24.0)
Drawn by: 308601
Drawn by: 13898
FIO2: 0.4 %
FIO2: 0.21 %
O2 Saturation: 99.7 %
O2 Saturation: 95 %
PATIENT TEMPERATURE: 98.6
PATIENT TEMPERATURE: 97.8
PCO2 ART: 23.2 mmHg — AB (ref 35.0–45.0)
PEEP/CPAP: 5 cmH2O
PH ART: 7.458 — AB (ref 7.350–7.450)
RATE: 16 resp/min
TCO2: 17 mmol/L (ref 0–100)
TCO2: 17 mmol/L (ref 0–100)
VT: 650 mL
pCO2 arterial: 22.7 mmHg — ABNORMAL LOW (ref 35.0–45.0)
pH, Arterial: 7.47 — ABNORMAL HIGH (ref 7.350–7.450)
pO2, Arterial: 160 mmHg — ABNORMAL HIGH (ref 80.0–100.0)
pO2, Arterial: 67.1 mmHg — ABNORMAL LOW (ref 80.0–100.0)

## 2013-10-13 LAB — TROPONIN I
TROPONIN I: 1.43 ng/mL — AB (ref <0.30)
Troponin I: 1.71 ng/mL (ref <0.30)
Troponin I: 0.77 ng/mL (ref <0.30)

## 2013-10-13 LAB — PROTIME-INR
INR: 3.36 — ABNORMAL HIGH (ref 0.00–1.49)
Prothrombin Time: 32.8 seconds — ABNORMAL HIGH (ref 11.6–15.2)

## 2013-10-13 LAB — GLUCOSE, CAPILLARY
GLUCOSE-CAPILLARY: 111 mg/dL — AB (ref 70–99)
GLUCOSE-CAPILLARY: 115 mg/dL — AB (ref 70–99)
GLUCOSE-CAPILLARY: 85 mg/dL (ref 70–99)
GLUCOSE-CAPILLARY: 96 mg/dL (ref 70–99)
Glucose-Capillary: 111 mg/dL — ABNORMAL HIGH (ref 70–99)
Glucose-Capillary: 109 mg/dL — ABNORMAL HIGH (ref 70–99)

## 2013-10-13 LAB — URINE CULTURE

## 2013-10-13 LAB — PROCALCITONIN: Procalcitonin: 0.2 ng/mL

## 2013-10-13 LAB — PHOSPHORUS: Phosphorus: 2.3 mg/dL (ref 2.3–4.6)

## 2013-10-13 LAB — TRIGLYCERIDES: Triglycerides: 87 mg/dL (ref <150)

## 2013-10-13 MED ORDER — CHLORHEXIDINE GLUCONATE CLOTH 2 % EX PADS
6.0000 | MEDICATED_PAD | Freq: Every day | CUTANEOUS | Status: AC
  Administered 2013-10-14 – 2013-10-18 (×5): 6 via TOPICAL

## 2013-10-13 MED ORDER — MUPIROCIN 2 % EX OINT
1.0000 "application " | TOPICAL_OINTMENT | Freq: Two times a day (BID) | CUTANEOUS | Status: AC
  Administered 2013-10-14 – 2013-10-18 (×10): 1 via NASAL
  Filled 2013-10-13 (×2): qty 22

## 2013-10-13 MED ORDER — POTASSIUM CHLORIDE 20 MEQ/15ML (10%) PO LIQD
40.0000 meq | Freq: Once | ORAL | Status: AC
  Administered 2013-10-13: 40 meq
  Filled 2013-10-13: qty 30

## 2013-10-13 MED ORDER — SODIUM CHLORIDE 0.45 % IV SOLN
INTRAVENOUS | Status: DC
  Filled 2013-10-13: qty 1000

## 2013-10-13 MED ORDER — MIDAZOLAM HCL 2 MG/2ML IJ SOLN
1.0000 mg | INTRAMUSCULAR | Status: DC | PRN
  Administered 2013-10-13: 1 mg via INTRAVENOUS
  Filled 2013-10-13: qty 2

## 2013-10-13 MED ORDER — ASPIRIN 300 MG RE SUPP
300.0000 mg | Freq: Every day | RECTAL | Status: DC
  Filled 2013-10-13: qty 1

## 2013-10-13 MED ORDER — MIDAZOLAM HCL 2 MG/2ML IJ SOLN
2.0000 mg | Freq: Once | INTRAMUSCULAR | Status: AC
  Administered 2013-10-13: 2 mg via INTRAVENOUS
  Filled 2013-10-13: qty 2

## 2013-10-13 MED ORDER — SODIUM CHLORIDE 0.45 % IV SOLN
INTRAVENOUS | Status: DC
  Administered 2013-10-13: 10:00:00 via INTRAVENOUS

## 2013-10-13 MED ORDER — MIDAZOLAM HCL 2 MG/2ML IJ SOLN
INTRAMUSCULAR | Status: AC
  Administered 2013-10-13: 2 mg
  Filled 2013-10-13: qty 2

## 2013-10-13 MED ORDER — ASPIRIN 325 MG PO TABS
325.0000 mg | ORAL_TABLET | Freq: Every day | ORAL | Status: DC
  Administered 2013-10-13: 325 mg via ORAL
  Filled 2013-10-13 (×2): qty 1

## 2013-10-13 NOTE — Clinical Documentation Improvement (Addendum)
  Query 1 of 2   Please document the Possible, Probable, Suspected or Known Cause of the patient's respiratory failure.   Query 2 of 2  Troponins this admission: 0.43 1.96 2.33 2.03 1.71 1.43  Please document the clinical significance, if any, of the elevated Troponin levels.   Thank You,  Erling Conte ,RN BSN CCDS Certified Clinical Documentation Specialist:  [facility]

## 2013-10-13 NOTE — Progress Notes (Signed)
eLink Physician-Brief Progress Note Patient Name: [name] [name] DOB: 1941/02/12 MRN: [id number]  Date of Service  10/13/2013   HPI/Events of Note   Reaching for tube despite sedation  eICU Interventions  Temp wrist restraints + sedation Likely extubation in AM      Douglas B McQuaid 10/13/2013, 1:30 AM

## 2013-10-13 NOTE — Progress Notes (Addendum)
I have had extensive discussions with family son. daughter in law. We discussed patients current circumstances and organ failures. We also discussed patient's prior wishes under circumstances such as this. Family has decided to NOT perform resuscitation if arrest but to continue current medical support for now. NO ACLS of any kind Elective, semi elective ETT ok NO ETT if primary arrest  If ett = TRACh , they r aware  Lavon Paganini. Titus Mould, MD, [place] Pgr: [facility]

## 2013-10-13 NOTE — Progress Notes (Signed)
Subjective: Remains intubated and on mechanical ventilation. Patient is unresponsive to person surrounding and has been following simple commands. No recurrence of seizure activity reported.  Objective: Current vital signs: BP 137/66  Pulse 70  Temp(Src) 99 F (37.2 C) (Axillary)  Resp 16  Ht 6\' 2"  (1.88 m)  Wt 75.5 kg (166 lb 7.2 oz)  BMI 21.36 kg/m2  SpO2 100%  Neurologic Exam: Intubated and on mechanical ventilation with spontaneous respirations noted. Patient is no longer on sedating medication. Patient was able to partially open eyes as well as follow simple commands, and at times attempted to speak. Extraocular movements were intact with oculocephalic maneuvers. No facial weakness noted. Patient upper extremities voluntarily on command. There was also minimal lower extremity movement distally on command.  Medications: I have reviewed the patient's current medications.  Assessment/Plan: No further seizure is reported. Patient is tolerating Keppra and Dilantin will. MRI of the brain is pending. Acute stroke cannot be ruled out at this point.  Recommend no changes in current management. We will continue to follow this patient with you.  C.R. Nicole Kindred, MD [facility] [phone]  10/13/2013  10:26 AM

## 2013-10-13 NOTE — Progress Notes (Signed)
Call placed to MRI to schedule time for this pt. They stated they will call me back when they are able to accomidate this pt.

## 2013-10-13 NOTE — Progress Notes (Signed)
[facility]  Patient Status: Active (receiving services up to time of hospitalization)  [facility] is providing the following services: RN  If patient discharges after hours, please call [phone].   [facility] 10/13/2013, 11:07 AM

## 2013-10-13 NOTE — Consult Note (Signed)
WOC ostomy follow up Stoma type/location:  Urostomy: RLQ Colostomy: LLQ Stomal assessment/size:  Urostomy: 0.5cm x 0.5cm opening, no stoma Colostomy: budded stoma, 1 3/4' round, does retract to flush with stimulation  Peristomal assessment:  Urostomy: some maceration from opening cut large and urine exposure to skin Colostomy: intact Treatment options for stomal/peristomal skin: none needed Output  Urostomy: clear yellow urine, hooked to BSD Colostomy: green, pasty Ostomy pouching: 2pc. 2 1/4" placed at both the urostomy and colostomy. Urostomy hooked back to BSD. Wafers dated, plan for 2x wk change T/Fri Education provided: pt on vent and sedated  WOC team will remain available for ostomy care. [place], [place]

## 2013-10-13 NOTE — Progress Notes (Signed)
ANTICOAGULATION CONSULT NOTE - Follow Up Consult  Pharmacy Consult for Coumadin Indication: atrial fibrillation  No Known Allergies  Patient Measurements: Height: 6\' 2"  (188 cm) Weight: 166 lb 7.2 oz (75.5 kg) IBW/kg (Calculated) : 82.2  Vital Signs: Temp: 99.6 F (37.6 C) (05/01 1220) Temp src: Axillary (05/01 1220) BP: 137/66 mmHg (05/01 0900) Pulse Rate: 70 (05/01 0900)  Labs:  Recent Labs  10/11/13 2252 10/12/13 0348  10/12/13 2000 10/12/13 2042 10/13/13 0200 10/13/13 0500 10/13/13 0530 10/13/13 0800 10/13/13 0830  HGB 12.0*  --   --   --   --   --  9.3*  --   --   --   HCT 35.5*  --   --   --   --   --  27.0*  --   --   --   PLT 321  --   --   --   --   --  227  --   --   --   LABPROT  --  20.6*  --   --   --   --   --  32.8*  --   --   INR  --  1.83*  --   --   --   --   --  3.36*  --   --   CREATININE 1.60* 1.40*  < >  --  1.35  --  1.32  --   --  1.34  CKTOTAL  --  139  --   --   --   --   --   --   --   --   TROPONINI  --  0.43*  < > 2.03*  --  1.71*  --   --  1.43*  --   < > = values in this interval not displayed.  Estimated Creatinine Clearance: 54 ml/min (by C-G formula based on Cr of 1.34).   Medications:  Scheduled:  . antiseptic oral rinse  15 mL Mouth Rinse QID  . aspirin  300 mg Rectal Daily  . cefTAZidime (FORTAZ)  IV  1 g Intravenous Q12H  . chlorhexidine  15 mL Mouth Rinse BID  . cholecalciferol  2,000 Units Oral Daily  . famotidine (PEPCID) IV  20 mg Intravenous Q12H  . insulin aspart  0-9 Units Subcutaneous 6 times per day  . levETIRAcetam  1,000 mg Intravenous Q12H  . pantoprazole (PROTONIX) IV  40 mg Intravenous Q24H  . phenytoin (DILANTIN) IV  100 mg Intravenous 3 times per day  . simvastatin  20 mg Oral q1800  . Warfarin - Pharmacist Dosing Inpatient   Does not apply q1800    Assessment: 73 yo M on Coumadin PTA for PAF.  INR has jumped overnight 1.83 >> 3.36 and is now above goal.  Unsure of specific reason for INR elevation.   No bleeding noted.    Goal of Therapy:  INR 2-3 Monitor platelets by anticoagulation protocol: Yes   Plan:  No Coumadin tonight. D/C SQ heparin as INR now >2 Continue daily INR.  **Consider d/c IV Protonix as pt is already on IV Pepcid and there is a Engineer, materials of IV Protonix.  [employer], Pharm.D., BCPS Clinical Pharmacist Pager [phone] 10/13/2013 1:10 PM

## 2013-10-13 NOTE — Progress Notes (Signed)
Second call to MRI placed, stating that pt is off vent and still needs MRI. MRI staff will try to get pt before shift change, and they are very backed up. RN ready at any time.

## 2013-10-13 NOTE — H&P (Signed)
PULMONARY / CRITICAL CARE MEDICINE   Name: [name] [name] MRN: [id number] DOB: 1940/11/06    ADMISSION DATE:  10/11/2013 CONSULTATION DATE:  10/12/2013  REFERRING MD :  ED PRIMARY SERVICE: PCCM  CHIEF COMPLAINT:  Seizures  BRIEF PATIENT DESCRIPTION: [name] [name] is a 73 y.o. 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    SIGNIFICANT EVENTS / STUDIES:  4/30 CT head: No acute process; large remote R MCA infarct 4/30 Admit with seizures 4/30 pCXR: Interstitial edema  4/20 lactic up, abx started, line placed, bolus given  LINES / TUBES: PIV 4/29>> OETT 4/30>> lef tIJ 4/30>>>  CULTURES: MRSA PCR 4/29>>  BC 4/30>> UA 4/30>> UC 4/30>>50 k yeast   ANTIBIOTICS: ceftaz 4/30>>>  Overnight: MS improved, weaning well  VITAL SIGNS: Temp:  [98.2 F (36.8 C)-99.9 F (37.7 C)] 99 F (37.2 C) (05/01 0318) Pulse Rate:  [63-124] 63 (05/01 0747) Resp:  [15-20] 16 (05/01 0747) BP: (91-156)/(46-67) 98/46 mmHg (05/01 0747) SpO2:  [95 %-100 %] 100 % (05/01 0747) FiO2 (%):  [40 %] 40 % (05/01 0747) Weight:  [75.5 kg (166 lb 7.2 oz)] 75.5 kg (166 lb 7.2 oz) (05/01 0452)  HEMODYNAMICS: CVP:  [0 mmHg-10 mmHg] 0 mmHg VENTILATOR SETTINGS: Vent Mode:  [-] PRVC FiO2 (%):  [40 %] 40 % Set Rate:  [16 bmp-20 bmp] 16 bmp Vt Set:  [650 mL] 650 mL PEEP:  [5 cmH20] 5 cmH20 Plateau Pressure:  [15 cmH20-18 cmH20] 15 cmH20  INTAKE / OUTPUT: Intake/Output     04/30 0701 - 05/01 0700 05/01 0701 - 05/02 0700   I.V. (mL/kg) 323.8 (4.3)    NG/GT 361    IV Piggyback 1420    Total Intake(mL/kg) 2104.8 (27.9)    Urine (mL/kg/hr) 1350 (0.7)    Stool 150 (0.1)    Total Output 1500     Net +604.8           PHYSICAL EXAMINATION: General:  Intubated.  Neuro:  Following commands HEENT:  3 mm pupils, jvd wnl, line clean  Cardiovascular: s1s2  tachycardic, regular rhythm  Chest/Lungs:  Coarse  unchanged Abdomen:  +bs, soft, non-distended; urostomy and colostomy present Musculoskeletal:  no obvious deformities Skin:  warm, dry; without edema  LABS:  CBC  Recent Labs Lab 10/11/13 2252 10/13/13 0500  WBC 11.7* 9.0  HGB 12.0* 9.3*  HCT 35.5* 27.0*  PLT 321 227   Coag's  Recent Labs Lab 10/12/13 0348 10/13/13 0530  INR 1.83* 3.36*   BMET  Recent Labs Lab 10/12/13 0842 10/12/13 2042 10/13/13 0500  NA 139 142 142  K 4.4 3.8 3.5*  CL 109 113* 113*  CO2 14* 15* 16*  BUN 19 16 16   CREATININE 1.42* 1.35 1.32  GLUCOSE 102* 123* 114*   Electrolytes  Recent Labs Lab 10/11/13 2252 10/12/13 0348 10/12/13 0842 10/12/13 2042 10/13/13 0500  CALCIUM 9.3 8.7 8.2* 7.9* 7.8*  MG 1.7 2.0  --   --   --   PHOS 3.9  --   --   --  2.3   Sepsis Markers  Recent Labs Lab 10/12/13 0150 10/12/13 1105 10/12/13 1450 10/13/13 0500  LATICACIDVEN 1.8 3.1*  --   --   PROCALCITON  --   --  <0.10 0.20   ABG  Recent Labs Lab 10/12/13 0216 10/12/13 0930 10/13/13 0355  PHART 7.249* 7.480* 7.470*  PCO2ART 38.6 18.8* 22.7*  PO2ART 68.0*  170.0* 160.0*   Liver Enzymes  Recent Labs Lab 10/11/13 2252 10/13/13 0500  AST 17 24  ALT 8 7  ALKPHOS 52 35*  BILITOT 0.4 <0.2*  ALBUMIN 3.7 2.6*   Cardiac Enzymes  Recent Labs Lab 10/12/13 0348  10/12/13 2000 10/13/13 0200 10/13/13 0800  TROPONINI 0.43*  < > 2.03* 1.71* 1.43*  PROBNP 2369.0*  --   --   --   --   < > = values in this interval not displayed. Glucose  Recent Labs Lab 10/12/13 1242 10/12/13 1538 10/12/13 1938 10/12/13 2333 10/13/13 0320 10/13/13 0825  GLUCAP 86 84 106* 135* 111* 96    Imaging Ct Head Wo Contrast  10/11/2013   CLINICAL DATA:  Seizure  EXAM: CT HEAD WITHOUT CONTRAST  TECHNIQUE: Contiguous axial images were obtained from the base of the skull through the vertex without intravenous contrast.  COMPARISON:  Prior CT from 10/03/2013  FINDINGS: Extensive encephalomalacia throughout  the right MCA territory again seen, compatible with remote right MCA territory infarct. Apparent differences in area of the encephalomalacia as compared to the recent exam likely related to angulation of the gantry. Generalized atrophy again noted. No acute intracranial hemorrhage or infarct. No mass lesion or midline shift. No extra-axial fluid collection. Atherosclerotic calcifications within the carotid siphons and bilateral vertebral arteries noted.  Calvarium is normal. Scalp soft tissues unremarkable. Orbits are within normal limits. The paranasal sinuses and mastoid air cells are well pneumatized and free of fluid.  IMPRESSION: 1. No acute intracranial process. 2. Large remote right MCA territory infarct.   Electronically Signed   By: Jeannine Boga M.D.   On: 10/11/2013 23:15   Dg Chest Port 1 View  10/13/2013   CLINICAL DATA:  Pulmonary edema.  Seizures.  Intubated.  EXAM: PORTABLE CHEST - 1 VIEW  COMPARISON:  Radiographs dated 10/12/2013  FINDINGS: Endotracheal tube and NG tube and left central venous catheter all appear in good position. Pulmonary edema has completely resolved. Minimal atelectasis at the right lung base laterally. Lungs are otherwise clear. No osseous abnormality.  IMPRESSION: Resolution of pulmonary edema. Minimal atelectasis at the right lung base.   Electronically Signed   By: Rozetta Nunnery M.D.   On: 10/13/2013 08:23   Dg Chest Port 1 View  10/12/2013   CLINICAL DATA:  Left IJ line placement.  EXAM: PORTABLE CHEST - 1 VIEW  COMPARISON:  Chest x-ray 10/12/2013.  FINDINGS: Endotracheal tube, left IJ tube, NG tube in good anatomic position. Mediastinum and hilar structures normal. Heart size normal. No pleural effusion or pneumothorax. No acute bony abnormality.  IMPRESSION: 1. Good line and tube positions. 2. No acute cardiopulmonary disease.   Electronically Signed   By: Marcello Moores  Register   On: 10/12/2013 12:54   Dg Chest Port 1 View  10/12/2013   CLINICAL DATA:  Assess  endotracheal tube position  EXAM: PORTABLE CHEST - 1 VIEW  COMPARISON:  DG CHEST 1V PORT dated 08/27/2013  FINDINGS: The endotracheal tube tip lies approximately 3 cm above the crotch of the carina. The lungs are well-expanded. The interstitial markings are increased bilaterally. There is no pleural effusion or pneumothorax. The cardiopericardial silhouette is normal in size. The pulmonary vascularity is indistinct. There are 5 sternal wires visible. The observed portions of the bony thorax appear normal.  IMPRESSION: 1. The endotracheal tube appears be in reasonable position 3 cm above the crotch of the carina. 2. The pulmonary interstitial markings are increased. This may reflect pulmonary interstitial edema  of cardiac or noncardiac cause. There is no alveolar pneumonia.   Electronically Signed   By: David  Martinique   On: 10/12/2013 01:25   Dg Abd Portable 1v  10/12/2013   CLINICAL DATA:  Orogastric tube placement  EXAM: PORTABLE ABDOMEN - 1 VIEW  COMPARISON:  None.  FINDINGS: OG tube crosses the gastroesophageal junction and extends inferiorly with its tip in the right mid abdomen. Based upon course and position,location of tip is most consistent with descending duodenum. There is note of a left lower quadrant ostomy. There is a nonobstructive gas pattern.  IMPRESSION: Orogastric tube as described above with tip projecting over the anticipated position of the descending duodenum.   Electronically Signed   By: Skipper Cliche M.D.   On: 10/12/2013 12:50   ASSESSMENT / PLAN:  PULMONARY A:  Acute resp failure, uncompensated met acidosis P: - reduce MV if stays on vent -wean this am cpap5 ps 5, goal 30 min ,assess rsbi, cough -follow rt base haziness on am pcxr  CARDIOVASCULAR A:  - hx of CAD, s/p CABG - hx of PAF per neurology's note 08/22/13. On Coumadin, but INR subtherapeutic - hx of stroke, MCA territory. - HTN h/o -drop in BP improved with volume, cvp was 4 - hypovolemia -r/o stress  ischemia  P:  - hold home Cardizem, lisinopril due to soft blood pressure - continue to hold coreg - continue coumadin -cvp noted, continued to allow pos pressure -cortisol 16, if drops BP add stress roids -tsh wnl -echo -asa, may need cards evaluation  RENAL A:   - hx of bladder cancer-->s/p of urostomy-->no signs of infection around urostomy - Bladder involvement from metastasized colon cancer-->s/p of cystectomy and urostomy tube. - hx of CKD-III with BL cre 1.33 to 2.34. creatinine slightly elevated from 1.33 on 3/24-->1.66, but seems be to in his baseline range - NON AG acidosis  P:   - avoid saline -add bicarb for NON AG  X 1 iter -k supp  GASTROINTESTINAL A:   - hx of GERD - hx of colon cancer-->s/p of colostomy, no signs of infection around the colostomy site.  - Colon cancer with resection (2001), metastatic colon cancer (2002) involving bladder with cystectomy and urostomy tubes and colostomy. H/o cdiff  P:   - IV Pepcid - start TF, hold for weaning  -limit abx with h;o cdiff when able  HEMATOLOGIC A:   - Mild leukocytosis with a WBC 11.7--> likely secondary to seizure. - No signs of infection P:  Asa for ischemia stress coumadin  INFECTIOUS A:   - No signs of infection, no fever.  - CXR without infiltration  P:   - f/u CXR rt base -continued ceftaz -PCt to limit abx  ENDOCRINE A:    - No history of diabetes mellitus, no history of thyroid dysfunction P:   - Monitor CBG q4h, goal of CBG 140 to 180, meeting goals  NEUROLOGIC A:  - hx of seizure, on Keppra 500 mg bid a at home-->r/o subclinical status epilepticus.  - EtOH negative - CT-head: 1. No acute intracranial process, with large remote right MCA territory infarct  P: - Versed / Fentanyl gtt to goal RASS 0 to -1 - Dilantin 100 mg tid IV, lft in am  - continue home dose of Keppra 500 mg bid IV - MRi awaited -asa -full wua  CC time 35 min  Family updated  Global: weaning well,  hold TF  Raylene Miyamoto, MD [phone]

## 2013-10-13 NOTE — Progress Notes (Signed)
Respiratory has been called to obtain ABG.

## 2013-10-13 NOTE — Progress Notes (Signed)
10/13/2013- 1035- Respiratory care note- Pt suctioned and extubated at 1035 as per MD order.  RN and family at bedside for procedure.  Pt tolerated procedure well with pt placed on 2lpm for sats of 100% and hr88, with rr 18.  Pt able to vocalize post procedure.  Ventilator on stand-by at bedside with ambu-bag and mask avail if needed.  Pt resting comfortably.  S Jameek Bruntz rrt, rcp

## 2013-10-13 NOTE — Progress Notes (Signed)
eLink Physician-Brief Progress Note Patient Name: [name] [name] DOB: Aug 20, 1940 MRN: [id number]  Date of Service  10/13/2013   HPI/Events of Note   Pharmacy calling about too much sodium in bicarb order. Bicarb written for concern of non gap acidosis in morning. Current state of non gap acidosis not known  eICU Interventions  Check abg and decide on bicarb order  Also, order clean up for de-icu done   Intervention Category Major Interventions: Acid-Base disturbance - evaluation and management  Brand Males 10/13/2013, 8:26 PM

## 2013-10-13 NOTE — Progress Notes (Signed)
[place] Progress Note Patient Name: [name] [name] DOB: 06-26-1940 MRN: [id number]  Date of Service  10/13/2013   HPI/Events of Note   Agitation, uncomfortable on vent  eICU Interventions  Add versed prn   Intervention Category Minor Interventions: Agitation / anxiety - evaluation and management  Juanito Doom 10/13/2013, 2:13 AM

## 2013-10-13 NOTE — Progress Notes (Signed)
MRI called RN & they are supposed to send transport assistance at 2100.

## 2013-10-14 ENCOUNTER — Inpatient Hospital Stay (HOSPITAL_COMMUNITY): Payer: Medicare Other

## 2013-10-14 DIAGNOSIS — E872 Acidosis, unspecified: Secondary | ICD-10-CM

## 2013-10-14 DIAGNOSIS — I420 Dilated cardiomyopathy: Secondary | ICD-10-CM

## 2013-10-14 DIAGNOSIS — J9600 Acute respiratory failure, unspecified whether with hypoxia or hypercapnia: Secondary | ICD-10-CM

## 2013-10-14 DIAGNOSIS — J811 Chronic pulmonary edema: Secondary | ICD-10-CM

## 2013-10-14 DIAGNOSIS — I059 Rheumatic mitral valve disease, unspecified: Secondary | ICD-10-CM

## 2013-10-14 LAB — GLUCOSE, CAPILLARY
Glucose-Capillary: 94 mg/dL (ref 70–99)
Glucose-Capillary: 183 mg/dL — ABNORMAL HIGH (ref 70–99)
Glucose-Capillary: 110 mg/dL — ABNORMAL HIGH (ref 70–99)
Glucose-Capillary: 96 mg/dL (ref 70–99)
Glucose-Capillary: 112 mg/dL — ABNORMAL HIGH (ref 70–99)

## 2013-10-14 LAB — BASIC METABOLIC PANEL
BUN: 12 mg/dL (ref 6–23)
BUN: 12 mg/dL (ref 6–23)
BUN: 14 mg/dL (ref 6–23)
CALCIUM: 8.2 mg/dL — AB (ref 8.4–10.5)
CO2: 15 mEq/L — ABNORMAL LOW (ref 19–32)
CO2: 16 meq/L — AB (ref 19–32)
CO2: 13 meq/L — AB (ref 19–32)
CREATININE: 1.15 mg/dL (ref 0.50–1.35)
CREATININE: 1.27 mg/dL (ref 0.50–1.35)
Calcium: 8.1 mg/dL — ABNORMAL LOW (ref 8.4–10.5)
Calcium: 8.2 mg/dL — ABNORMAL LOW (ref 8.4–10.5)
Chloride: 109 mEq/L (ref 96–112)
Chloride: 111 mEq/L (ref 96–112)
Chloride: 109 mEq/L (ref 96–112)
Creatinine, Ser: 1.14 mg/dL (ref 0.50–1.35)
GFR calc Af Amer: 73 mL/min — ABNORMAL LOW (ref >90)
GFR calc non Af Amer: 62 mL/min — ABNORMAL LOW (ref >90)
GFR calc non Af Amer: 55 mL/min — ABNORMAL LOW (ref >90)
GFR, EST AFRICAN AMERICAN: 72 mL/min — AB (ref >90)
GFR, EST AFRICAN AMERICAN: 64 mL/min — AB (ref >90)
GFR, EST NON AFRICAN AMERICAN: 63 mL/min — AB (ref >90)
GLUCOSE: 92 mg/dL (ref 70–99)
Glucose, Bld: 89 mg/dL (ref 70–99)
Glucose, Bld: 208 mg/dL — ABNORMAL HIGH (ref 70–99)
POTASSIUM: 3.3 meq/L — AB (ref 3.7–5.3)
Potassium: 3.5 mEq/L — ABNORMAL LOW (ref 3.7–5.3)
Potassium: 3.8 mEq/L (ref 3.7–5.3)
SODIUM: 143 meq/L (ref 137–147)
Sodium: 140 mEq/L (ref 137–147)
Sodium: 142 mEq/L (ref 137–147)

## 2013-10-14 LAB — PROTIME-INR
INR: 2.85 — ABNORMAL HIGH (ref 0.00–1.49)
Prothrombin Time: 28.9 seconds — ABNORMAL HIGH (ref 11.6–15.2)

## 2013-10-14 LAB — CBC WITH DIFFERENTIAL/PLATELET
Basophils Absolute: 0.1 10*3/uL (ref 0.0–0.1)
Basophils Relative: 1 % (ref 0–1)
EOS ABS: 0 10*3/uL (ref 0.0–0.7)
Eosinophils Relative: 0 % (ref 0–5)
HEMATOCRIT: 28.4 % — AB (ref 39.0–52.0)
HEMOGLOBIN: 9.9 g/dL — AB (ref 13.0–17.0)
LYMPHS ABS: 2 10*3/uL (ref 0.7–4.0)
Lymphocytes Relative: 18 % (ref 12–46)
MCH: 29.6 pg (ref 26.0–34.0)
MCHC: 34.9 g/dL (ref 30.0–36.0)
MCV: 84.8 fL (ref 78.0–100.0)
MONO ABS: 1 10*3/uL (ref 0.1–1.0)
MONOS PCT: 10 % (ref 3–12)
NEUTROS PCT: 71 % (ref 43–77)
Neutro Abs: 7.5 10*3/uL (ref 1.7–7.7)
Platelets: 255 10*3/uL (ref 150–400)
RBC: 3.35 MIL/uL — ABNORMAL LOW (ref 4.22–5.81)
RDW: 17.6 % — ABNORMAL HIGH (ref 11.5–15.5)
WBC: 10.6 10*3/uL — ABNORMAL HIGH (ref 4.0–10.5)

## 2013-10-14 LAB — POCT I-STAT 3, ART BLOOD GAS (G3+)
Acid-base deficit: 11 mmol/L — ABNORMAL HIGH (ref 0.0–2.0)
BICARBONATE: 14.4 meq/L — AB (ref 20.0–24.0)
O2 Saturation: 100 %
TCO2: 15 mmol/L (ref 0–100)
pCO2 arterial: 29.8 mmHg — ABNORMAL LOW (ref 35.0–45.0)
pH, Arterial: 7.289 — ABNORMAL LOW (ref 7.350–7.450)
pO2, Arterial: 216 mmHg — ABNORMAL HIGH (ref 80.0–100.0)

## 2013-10-14 LAB — CULTURE, RESPIRATORY

## 2013-10-14 LAB — BLOOD GAS, ARTERIAL
Acid-base deficit: 16.5 mmol/L — ABNORMAL HIGH (ref 0.0–2.0)
BICARBONATE: 9.2 meq/L — AB (ref 20.0–24.0)
Drawn by: 13898
FIO2: 1 %
O2 Saturation: 94.9 %
PATIENT TEMPERATURE: 98.6
PH ART: 7.266 — AB (ref 7.350–7.450)
TCO2: 9.9 mmol/L (ref 0–100)
pCO2 arterial: 21 mmHg — ABNORMAL LOW (ref 35.0–45.0)
pO2, Arterial: 83.8 mmHg (ref 80.0–100.0)

## 2013-10-14 LAB — TROPONIN I
TROPONIN I: 1.6 ng/mL — AB (ref <0.30)
Troponin I: 0.85 ng/mL (ref <0.30)
Troponin I: 1.07 ng/mL (ref <0.30)
Troponin I: 1.34 ng/mL (ref <0.30)

## 2013-10-14 LAB — LEVETIRACETAM LEVEL: Levetiracetam Lvl: 40.9 ug/mL

## 2013-10-14 LAB — LACTIC ACID, PLASMA: LACTIC ACID, VENOUS: 2.6 mmol/L — AB (ref 0.5–2.2)

## 2013-10-14 LAB — CULTURE, RESPIRATORY W GRAM STAIN: Special Requests: NORMAL

## 2013-10-14 LAB — PRO B NATRIURETIC PEPTIDE: Pro B Natriuretic peptide (BNP): 28561 pg/mL — ABNORMAL HIGH (ref 0–125)

## 2013-10-14 LAB — PROCALCITONIN: Procalcitonin: <0.1 ng/mL

## 2013-10-14 MED ORDER — OXYCODONE-ACETAMINOPHEN 5-325 MG PO TABS: 1.0000 | ORAL_TABLET | Freq: Once | ORAL | Status: DC

## 2013-10-14 MED ORDER — DEXTROSE 5 % IV SOLN
1.0000 g | Freq: Three times a day (TID) | INTRAVENOUS | Status: DC
  Administered 2013-10-14: 1 g via INTRAVENOUS
  Filled 2013-10-14 (×3): qty 1

## 2013-10-14 MED ORDER — FUROSEMIDE 10 MG/ML IJ SOLN
40.0000 mg | Freq: Once | INTRAMUSCULAR | Status: AC
  Administered 2013-10-14: 40 mg via INTRAVENOUS

## 2013-10-14 MED ORDER — VANCOMYCIN HCL 10 G IV SOLR
1500.0000 mg | Freq: Once | INTRAVENOUS | Status: AC
  Administered 2013-10-14: 1500 mg via INTRAVENOUS
  Filled 2013-10-14: qty 1500

## 2013-10-14 MED ORDER — MORPHINE SULFATE 2 MG/ML IJ SOLN
1.0000 mg | Freq: Once | INTRAMUSCULAR | Status: AC
  Administered 2013-10-14: 1 mg via INTRAVENOUS

## 2013-10-14 MED ORDER — VANCOMYCIN HCL IN DEXTROSE 750-5 MG/150ML-% IV SOLN
750.0000 mg | Freq: Two times a day (BID) | INTRAVENOUS | Status: DC
  Filled 2013-10-14: qty 150

## 2013-10-14 MED ORDER — FENTANYL CITRATE 0.05 MG/ML IJ SOLN
50.0000 ug | INTRAMUSCULAR | Status: DC | PRN
  Administered 2013-10-14 (×2): 50 ug via INTRAVENOUS
  Filled 2013-10-14 (×2): qty 2

## 2013-10-14 MED ORDER — OXYCODONE HCL 5 MG PO TABS: 5.0000 mg | ORAL_TABLET | Freq: Once | ORAL | Status: DC

## 2013-10-14 MED ORDER — SIMVASTATIN 20 MG PO TABS
20.0000 mg | ORAL_TABLET | Freq: Every day | ORAL | Status: DC
  Administered 2013-10-14: 20 mg
  Filled 2013-10-14 (×2): qty 1

## 2013-10-14 MED ORDER — FENTANYL CITRATE 0.05 MG/ML IJ SOLN: 40.0000 ug | Freq: Once | INTRAMUSCULAR | Status: DC

## 2013-10-14 MED ORDER — PROPOFOL 10 MG/ML IV EMUL
5.0000 ug/kg/min | INTRAVENOUS | Status: DC
  Administered 2013-10-14: 5 ug/kg/min via INTRAVENOUS

## 2013-10-14 MED ORDER — SODIUM BICARBONATE 8.4 % IV SOLN
INTRAVENOUS | Status: DC
  Administered 2013-10-14 – 2013-10-18 (×6): via INTRAVENOUS
  Filled 2013-10-14 (×10): qty 100

## 2013-10-14 MED ORDER — LEVOFLOXACIN IN D5W 750 MG/150ML IV SOLN
750.0000 mg | INTRAVENOUS | Status: DC
  Filled 2013-10-14: qty 150

## 2013-10-14 MED ORDER — FENTANYL CITRATE 0.05 MG/ML IJ SOLN: 25.0000 ug | INTRAMUSCULAR | Status: DC | PRN

## 2013-10-14 MED ORDER — ACETAMINOPHEN 160 MG/5ML PO SOLN
500.0000 mg | ORAL | Status: DC | PRN
  Administered 2013-10-14: 500 mg
  Filled 2013-10-14: qty 20.3

## 2013-10-14 MED ORDER — PROPOFOL 10 MG/ML IV EMUL
INTRAVENOUS | Status: AC
  Filled 2013-10-14: qty 100

## 2013-10-14 MED ORDER — MORPHINE SULFATE 2 MG/ML IJ SOLN
INTRAMUSCULAR | Status: AC
  Filled 2013-10-14: qty 1

## 2013-10-14 MED ORDER — FENTANYL CITRATE 0.05 MG/ML IJ SOLN
200.0000 ug | Freq: Once | INTRAMUSCULAR | Status: AC
  Administered 2013-10-14: 200 ug via INTRAVENOUS

## 2013-10-14 MED ORDER — ASPIRIN 325 MG PO TABS
325.0000 mg | ORAL_TABLET | Freq: Every day | ORAL | Status: DC
  Administered 2013-10-15: 325 mg
  Filled 2013-10-14 (×2): qty 1

## 2013-10-14 MED ORDER — WARFARIN SODIUM 2 MG PO TABS
2.0000 mg | ORAL_TABLET | Freq: Once | ORAL | Status: AC
  Administered 2013-10-14: 2 mg via ORAL
  Filled 2013-10-14: qty 1

## 2013-10-14 MED ORDER — PROPOFOL 10 MG/ML IV EMUL
5.0000 ug/kg/min | INTRAVENOUS | Status: DC
  Administered 2013-10-14: 20 ug/kg/min via INTRAVENOUS
  Administered 2013-10-15: 15 ug/kg/min via INTRAVENOUS
  Filled 2013-10-14 (×2): qty 100

## 2013-10-14 MED ORDER — FENTANYL CITRATE 0.05 MG/ML IJ SOLN
INTRAMUSCULAR | Status: AC
  Administered 2013-10-14: 200 ug
  Filled 2013-10-14: qty 4

## 2013-10-14 MED ORDER — FUROSEMIDE 10 MG/ML IJ SOLN
INTRAMUSCULAR | Status: AC
  Filled 2013-10-14: qty 4

## 2013-10-14 MED ORDER — POTASSIUM CHLORIDE 10 MEQ/50ML IV SOLN
10.0000 meq | INTRAVENOUS | Status: AC
  Administered 2013-10-14 (×4): 10 meq via INTRAVENOUS
  Filled 2013-10-14 (×4): qty 50

## 2013-10-14 MED ORDER — ROCURONIUM BROMIDE 50 MG/5ML IV SOLN: 1.0000 mg/kg | Freq: Once | INTRAVENOUS | Status: DC

## 2013-10-14 NOTE — Progress Notes (Signed)
Called eLink MD due to pt temp = 99.5.  MD ordered Tylenol per OG tube.

## 2013-10-14 NOTE — Procedures (Signed)
Intubation Procedure Note Charles W Phillips [id number] 01/27/1930  Procedure: Intubation Indications: Respiratory insufficiency  Procedure Details Consent: Unable to obtain consent because of altered level of consciousness. Time Out: Verified patient identification, verified procedure, site/side was marked, verified correct patient position, special equipment/implants available, medications/allergies/relevent history reviewed, required imaging and test results available.  Performed  Maximum sterile technique was used including gloves and mask.   Induced with Propafol 100 and Fentanyl 200. Paralyzed with 40 mg Roc. Glidescope used, grade 1 view. 7.5 ETT placed after one attempt Secured at 25 cm Bilateral breath sounds and good color change   Evaluation Hemodynamic Status: BP stable throughout; O2 sats: transiently fell during during procedure Patient's Current Condition: stable Complications: No apparent complications Patient did tolerate procedure well. Chest X-ray ordered to verify placement.  CXR: pending.   Tayler Heiden R. Manali Mcelmurry 10/14/2013   

## 2013-10-14 NOTE — Progress Notes (Signed)
Patient having pain, no PRNs ordered.  Will order fentanyl 50 q2 PRN

## 2013-10-14 NOTE — Progress Notes (Signed)
ANTICOAGULATION CONSULT NOTE - Follow Up Consult  Pharmacy Consult for Coumadin Indication: atrial fibrillation  No Known Allergies  Patient Measurements: Height: 6\' 2"  (188 cm) Weight: 165 lb 5.5 oz (75 kg) IBW/kg (Calculated) : 82.2  Vital Signs: Temp: 97.5 F (36.4 C) (05/02 0800) Temp src: Axillary (05/02 0800) BP: 143/82 mmHg (05/02 0700) Pulse Rate: 114 (05/02 0700)  Labs:  Recent Labs  10/11/13 2252 10/12/13 0348  10/13/13 0500 10/13/13 0530  10/13/13 1410 10/14/13 0145 10/14/13 0400 10/14/13 0828  HGB 12.0*  --   --  9.3*  --   --   --   --  9.9*  --   HCT 35.5*  --   --  27.0*  --   --   --   --  28.4*  --   PLT 321  --   --  227  --   --   --   --  255  --   LABPROT  --  20.6*  --   --  32.8*  --   --   --  28.9*  --   INR  --  1.83*  --   --  3.36*  --   --   --  2.85*  --   CREATININE 1.60* 1.40*  < > 1.32  --   < >  --  1.15 1.14 1.27  CKTOTAL  --  139  --   --   --   --   --   --   --   --   TROPONINI  --  0.43*  < >  --   --   < > 0.77* 0.85*  --  1.07*  < > = values in this interval not displayed.  Estimated Creatinine Clearance: 56.6 ml/min (by C-G formula based on Cr of 1.27).   Medications:  Scheduled:  . antiseptic oral rinse  15 mL Mouth Rinse QID  . [START ON 10/15/2013] aspirin  325 mg Per Tube Daily  . chlorhexidine  15 mL Mouth Rinse BID  . Chlorhexidine Gluconate Cloth  6 each Topical Q0600  . insulin aspart  0-9 Units Subcutaneous 6 times per day  . levETIRAcetam  1,000 mg Intravenous Q12H  . mupirocin ointment  1 application Nasal BID  . pantoprazole (PROTONIX) IV  40 mg Intravenous Q24H  . phenytoin (DILANTIN) IV  100 mg Intravenous 3 times per day  . rocuronium  1 mg/kg Intravenous Once  . simvastatin  20 mg Per Tube q1800  . Warfarin - Pharmacist Dosing Inpatient   Does not apply q1800    Assessment: 73 yo M on Coumadin PTA for PAF.  INR is back down to 2.85. Will reduce dose today.   PTA = 2mg  qday except 4mg  MWF  Goal of  Therapy:  INR 2-3 Monitor platelets by anticoagulation protocol: Yes   Plan:   Coumadin 2mg  PO x1  Daily INR

## 2013-10-14 NOTE — Progress Notes (Signed)
[facility] ADULT ICU REPLACEMENT PROTOCOL FOR AM LAB REPLACEMENT ONLY  The patient does not apply for the [facility] Adult ICU Electrolyte Replacment Protocol based on the criteria listed below:   1. Is GFR >/= 40 ml/min? yes  Patient's GFR today is 63 2. Is urine output >/= 0.5 ml/kg/hr for the last 6 hours? no Patient's UOP is 0 ml/kg/hr 3. Is BUN < 60 mg/dL? yes  Patient's BUN today is 12 4. Abnormal electrolyte(s):K+3.3 5. Ordered repletion with: NA 6. If a panic level lab has been reported, has the CCM MD in charge been notified? yes.   Physician:  E Deterding  Constance Holster 10/14/2013 6:10 AM

## 2013-10-14 NOTE — Progress Notes (Addendum)
Called to see patient for worsening SOB and diaphoresis. On exam Alert but not orientation. Profusely diaphoretic. Sats labile even on NRB. Crackles bilaterally. Initially attempted to stabilize with morphine, BiPAP, and Lasix, however, unable to tolerate BiPAP. ABG then reviewed which is consistent with AG metabolic acidosis (when correcting AG for albumin). Likely no Non-Gap Acidosis as Delta delta is ~ 1.3. Will not add bicarb for now.  ABG and CXray ordered and pending. Will attempt diuresis.    Check EKG, Trops, Lacttic Acid  Start empiric antibiotics and obtain cultures .  CRITICAL CARE: The patient is critically ill with multiple organ systems failure and requires high complexity decision making for assessment and support, frequent evaluation and titration of therapies, application of advanced monitoring technologies and extensive interpretation of multiple databases. Critical Care Time devoted to patient care services described in this note is 45 minutes.

## 2013-10-14 NOTE — Progress Notes (Signed)
eLink MD called due to pain not being relieved w/ fentanly. Pt also has increased HR, BP and RR.  Pt has become very diaphoretic and displays an increased WOB. eLink MD is sending on site MD to evaluate pt.

## 2013-10-14 NOTE — Progress Notes (Signed)
eLink Physician-Brief Progress Note Patient Name: [name] [name] DOB: 06-25-1940 MRN: [id number]  Date of Service  10/14/2013   HPI/Events of Note  Patient more agitated with tachycardia, hypertension and drop in sats because he will not keep his oxygen on .  Patient on chronic oxycodone at home.  Has low grade temp.  Has had limited relief with fentanyl.   eICU Interventions  Plan: 1 time dose of tylenol 235 mg with 5 mg oxycodone. Continue to monitor patient   Intervention Category Intermediate Interventions: Pain - evaluation and management  Guadelupe Sabin Deterding 10/14/2013, 5:13 AM

## 2013-10-14 NOTE — Progress Notes (Signed)
Md called due to pt being pain.  MD placed order for pain med.

## 2013-10-14 NOTE — Progress Notes (Signed)
PULMONARY / CRITICAL CARE MEDICINE   Name: [name] [name] MRN: [id number] DOB: [birth year]    ADMISSION DATE:  10/11/2013 CONSULTATION DATE:  10/12/2013  REFERRING MD :  ED PRIMARY SERVICE: PCCM  CHIEF COMPLAINT:  Seizures  BRIEF PATIENT DESCRIPTION: [name] [name] is a 73 y.o. 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    SIGNIFICANT EVENTS / STUDIES:  4/30 CT head: No acute process; large remote R MCA infarct 4/30 Admit with seizures 4/30 pCXR: Interstitial edema  4/30 lactic up, abx started, line placed, bolus given 5/01 Extubated 5/02 Required re-intubation. Edema pattern on CXR  LINES / TUBES: ETT 4/30 >> 5/01, 5/02 >>  Left IJ CVL 4/30>>>  CULTURES: MRSA PCR 4/29>>  BC 4/30>> UA 4/30>> UC 4/30>>50 k yeast   ANTIBIOTICS: Anti-infectives   Start     Dose/Rate Route Frequency Ordered Stop   10/14/13 1800  vancomycin (VANCOCIN) IVPB 750 mg/150 ml premix  Status:  Discontinued     750 mg 150 mL/hr over 60 Minutes Intravenous Every 12 hours 10/14/13 0659 10/14/13 1016   10/14/13 1000  levofloxacin (LEVAQUIN) IVPB 750 mg  Status:  Discontinued     750 mg 100 mL/hr over 90 Minutes Intravenous Every 24 hours 10/14/13 0659 10/14/13 1016   10/14/13 0800  vancomycin (VANCOCIN) 1,500 mg in sodium chloride 0.9 % 500 mL IVPB     1,500 mg 250 mL/hr over 120 Minutes Intravenous  Once 10/14/13 0659 10/14/13 1053   10/14/13 0800  ceFEPIme (MAXIPIME) 1 g in dextrose 5 % 50 mL IVPB  Status:  Discontinued     1 g 100 mL/hr over 30 Minutes Intravenous 3 times per day 10/14/13 0659 10/14/13 1016   10/12/13 1430  cefTAZidime (FORTAZ) 1 g in dextrose 5 % 50 mL IVPB  Status:  Discontinued     1 g 100 mL/hr over 30 Minutes Intravenous Every 12 hours 10/12/13 1413 10/14/13 0658      SUBJ:  Required re-intubation this AM. Edema pattern on CXR   VITAL SIGNS: Temp:  [97.5 F (36.4  C)-99.1 F (37.3 C)] 98.8 F (37.1 C) (05/02 1603) Pulse Rate:  [75-134] 79 (05/02 1935) Resp:  [14-40] 17 (05/02 1935) BP: (96-170)/(56-140) 110/70 mmHg (05/02 1935) SpO2:  [87 %-100 %] 100 % (05/02 1935) FiO2 (%):  [40 %-100 %] 40 % (05/02 1935) Weight:  [75 kg (165 lb 5.5 oz)] 75 kg (165 lb 5.5 oz) (05/02 0457)  HEMODYNAMICS: CVP:  [2 mmHg] 2 mmHg VENTILATOR SETTINGS: Vent Mode:  [-] PRVC FiO2 (%):  [40 %-100 %] 40 % Set Rate:  [14 bmp-20 bmp] 15 bmp Vt Set:  [500 mL] 500 mL PEEP:  [5 cmH20] 5 cmH20 Plateau Pressure:  [11 cmH20-17 cmH20] 11 cmH20  INTAKE / OUTPUT: Intake/Output     05/02 0701 - 05/03 0700   I.V. (mL/kg) 45.3 (0.6)   IV Piggyback 50   Total Intake(mL/kg) 95.3 (1.3)   Urine (mL/kg/hr) 600 (0.6)   Stool    Total Output 600   Net -504.7       Urine Occurrence 2 x    PHYSICAL EXAMINATION: General:  Intubated.  Neuro:  RASS -2, MAEs HEENT: WNL Cardiovascular: RRR s M Chest/Lungs:  Clear anteriorly Abdomen:  +bs, soft, non-distended; urostomy and colostomy present Ext:  Warm, without edema  LABS:  CBC  Recent Labs Lab 10/11/13 2252 10/13/13 0500 10/14/13 0400  WBC 11.7*  9.0 10.6*  HGB 12.0* 9.3* 9.9*  HCT 35.5* 27.0* 28.4*  PLT 321 227 255   Coag's  Recent Labs Lab 10/12/13 0348 10/13/13 0530 10/14/13 0400  INR 1.83* 3.36* 2.85*   BMET  Recent Labs Lab 10/14/13 0145 10/14/13 0400 10/14/13 0828  NA 140 142 143  K 3.5* 3.3* 3.8  CL 109 111 109  CO2 15* 16* 13*  BUN _0 CREATININE 1.15 1.14 1.27  GLUCOSE 89 92 208*   Electrolytes  Recent Labs Lab 10/11/13 2252 10/12/13 0348  10/13/13 0500  10/14/13 0145 10/14/13 0400 10/14/13 0828  CALCIUM 9.3 8.7  < > 7.8*  < > 8.1* 8.2* 8.2*  MG 1.7 2.0  --   --   --   --   --   --   PHOS 3.9  --   --  2.3  --   --   --   --   < > = values in this interval not displayed. Sepsis Markers  Recent Labs Lab 10/12/13 0150 10/12/13 1105 10/12/13 1450 10/13/13 0500  10/14/13 0400 10/14/13 0843  LATICACIDVEN 1.8 3.1*  --   --   --  2.6*  PROCALCITON  --   --  <0.10 0.20 <0.10  --    ABG  Recent Labs Lab 10/13/13 2059 10/14/13 0550 10/14/13 0820  PHART 7.458* 7.266* 7.289*  PCO2ART 23.2* 21.0* 29.8*  PO2ART 67.1* 83.8 216.0*   Liver Enzymes  Recent Labs Lab 10/11/13 2252 10/13/13 0500  AST 17 24  ALT 8 7  ALKPHOS 52 35*  BILITOT 0.4 <0.2*  ALBUMIN 3.7 2.6*   Cardiac Enzymes  Recent Labs Lab 10/12/13 0348  10/14/13 0145 10/14/13 0828 10/14/13 1245  TROPONINI 0.43*  < > 0.85* 1.07* 1.60*  PROBNP 2369.0*  --   --  28561.0*  --   < > = values in this interval not displayed. Glucose  Recent Labs Lab 10/13/13 1939 10/13/13 2336 10/14/13 0337 10/14/13 0750 10/14/13 1209 10/14/13 1555  GLUCAP 109* 85 94 183* 110* 96    Imaging Dg Chest Port 1 View  10/14/2013   CLINICAL DATA:  Assess edema.  EXAM: PORTABLE CHEST - 1 VIEW  COMPARISON:  10/13/2013  FINDINGS: Sequelae of prior CABG are again identified. Cardiac silhouette is mildly enlarged. Endotracheal and enteric tubes have been removed. Left jugular central venous catheter remains in place with tip overlying the upper SVC. Pulmonary vasculature is indistinct and there are increased patchy perihilar opacities. No definite pleural effusion or pneumothorax is identified.  IMPRESSION: 1. Interval extubation. 2. Worsening pulmonary edema.   Electronically Signed   By: Logan Bores   On: 10/14/2013 08:17   Dg Chest Port 1 View  10/14/2013   CLINICAL DATA:  Intubation  EXAM: PORTABLE CHEST - 1 VIEW  COMPARISON:  10/14/2013  FINDINGS: The ET tube tip is above the carina. There is a left IJ catheter with tip in the projection of the SVC. The heart size appears normal. Moderate diffuse pulmonary edema is unchanged from previous exam.  IMPRESSION: 1. Interval intubation with tip above the carina. 2. No change in pulmonary edema   Electronically Signed   By: Kerby Moors M.D.   On:  10/14/2013 07:47   Dg Chest Port 1 View  10/13/2013   CLINICAL DATA:  Pulmonary edema.  Seizures.  Intubated.  EXAM: PORTABLE CHEST - 1 VIEW  COMPARISON:  Radiographs dated 10/12/2013  FINDINGS: Endotracheal tube and NG tube and left central  venous catheter all appear in good position. Pulmonary edema has completely resolved. Minimal atelectasis at the right lung base laterally. Lungs are otherwise clear. No osseous abnormality.  IMPRESSION: Resolution of pulmonary edema. Minimal atelectasis at the right lung base.   Electronically Signed   By: Rozetta Nunnery M.D.   On: 10/13/2013 08:23   Dg Abd Portable 1v  10/14/2013   CLINICAL DATA:  NG tube placement.  EXAM: PORTABLE ABDOMEN - 1 VIEW  COMPARISON:  10/12/2013  FINDINGS: Enteric tube is present with side hole overlying the gastric body and tip in the region of the distal stomach. Gas is present in nondilated loops of bowel. Calcification /residual oral contrast material in the right mid abdomen is unchanged. Surgical clips are present in the upper abdomen and pelvis. No acute osseous abnormality is identified. Left lower quadrant ostomy is noted.  IMPRESSION: Enteric tube as above with tip in the region of the distal stomach.   Electronically Signed   By: Logan Bores   On: 10/14/2013 12:25   ASSESSMENT / PLAN:  PULMONARY A:  Acute resp failure Pulmonary edema uncompensated met acidosis Failed extubation 5/01 P: Cont full vent support - settings reviewed and/or adjusted Cont vent bundle Daily SBT if/when meets criteria  CARDIOVASCULAR A:  - hx of CAD, s/p CABG - hx of PAF. On Coumadin, but INR subtherapeutic - hx of stroke, MCA territory. -drop in BP improved with volume, cvp was 4 - hypovolemia -Elevated trop I - NSTEMI P:  Cont to follow cardiac markers Echo re-ordered Not candidate for invasive eval presently  RENAL A:   - hx of bladder cancer-->s/p of urostomy - hx of CKD-III with BL cre 1.33 to 2.34. creatinine slightly elevated  from 1.33 on 3/24-->1.66, but seems be to in his baseline range - NON AG acidosis P:   Monitor BMET intermittently Monitor I/Os Correct electrolytes as indicated HCO3 infusion ordered 5/02  GASTROINTESTINAL A:   - hx of GERD - hx of colon cancer-->s/p of colostomy, no signs of infection around the colostomy site.  - Colon cancer with resection (2001), metastatic colon cancer (2002) involving bladder with cystectomy and urostomy tubes and colostomy. H/o cdiff  P:   - IV Pepcid - start TF, hold for weaning  -limit abx with h;o cdiff when able  HEMATOLOGIC A:    P:    INFECTIOUS A:   No signs of infection, no fever. Normal PCT P:   Micro and abx as above  ENDOCRINE A:    - No history of diabetes mellitus, no history of thyroid dysfunction P:   - Monitor CBG q4h, goal of CBG 140 to 180, meeting goals  NEUROLOGIC A:  - hx of seizure, on Keppra 500 mg bid a at home-->r/o subclinical status epilepticus.  - EtOH negative - CT-head: 1. No acute intracranial process, with large remote right MCA territory infarct  P: - Versed / Fentanyl gtt to goal RASS 0 to -1 - Dilantin 100 mg tid IV, lft in am  - continue home dose of Keppra 500 mg bid IV - MRi awaited -asa -full wua  CC time 35 min  No family @ bedside   Merton Border, MD ; [facility] service Mobile [phone].  After 5:30 PM or weekends, call [phone]

## 2013-10-14 NOTE — Progress Notes (Signed)
ANTIBIOTIC CONSULT NOTE - INITIAL  Pharmacy Consult for Vancomycin/Cefepime/Levaquin Indication: rule out pneumonia  No Known Allergies  Patient Measurements: Height: 6\' 2"  (188 cm) Weight: 165 lb 5.5 oz (75 kg) IBW/kg (Calculated) : 82.2  Vital Signs: Temp: 99.1 F (37.3 C) (05/02 0312) Temp src: Axillary (05/02 0312) BP: 161/85 mmHg (05/02 0600) Pulse Rate: 117 (05/02 0600) Intake/Output from previous day: 05/01 0701 - 05/02 0700 In: 818.8 [I.V.:618.8; IV Piggyback:200] Out: 1900 [Urine:1400; Stool:500] Intake/Output from this shift: Total I/O In: 200 [IV Piggyback:200] Out: 250 [Urine:250]  Labs:  Recent Labs  10/11/13 2252  10/13/13 0500 10/13/13 0830 10/14/13 0145 10/14/13 0400  WBC 11.7*  --  9.0  --   --  10.6*  HGB 12.0*  --  9.3*  --   --  9.9*  PLT 321  --  227  --   --  255  CREATININE 1.60*  < > 1.32 1.34 1.15 1.14  < > = values in this interval not displayed. Estimated Creatinine Clearance: 63 ml/min (by C-G formula based on Cr of 1.14). No results found for this basename: VANCOTROUGH, Corlis Leak, VANCORANDOM, [place], GENTPEAK, GENTRANDOM, TOBRATROUGH, TOBRAPEAK, TOBRARND, AMIKACINPEAK, AMIKACINTROU, AMIKACIN,  in the last 72 hours   Microbiology: Recent Results (from the past 720 hour(s))  MRSA PCR SCREENING     Status: Abnormal   Collection Time    10/12/13  2:33 AM      Result Value Ref Range Status   MRSA by PCR POSITIVE (*) NEGATIVE Final   Comment:            The GeneXpert MRSA Assay (FDA     approved for NASAL specimens     only), is one component of a     comprehensive MRSA colonization     surveillance program. It is not     intended to diagnose MRSA     infection nor to guide or     monitor treatment for     MRSA infections.     RESULT CALLED TO, READ BACK BY AND VERIFIED WITH:     Nelta Numbers RN [id number] GREEN R  URINE CULTURE     Status: None   Collection Time    10/12/13  2:54 AM      Result Value Ref Range Status   Specimen Description URINE, RANDOM   Final   Special Requests NONE   Final   Culture  Setup Time     Final   Value: 10/12/2013 10:01     Performed at [place]     Final   Value: 50,000 COLONIES/ML     Performed at [facility]   Culture     Final   Value: YEAST     Performed at [facility]   Report Status 10/13/2013 FINAL   Final  CULTURE, BLOOD (ROUTINE X 2)     Status: None   Collection Time    10/12/13  3:48 AM      Result Value Ref Range Status   Specimen Description BLOOD RIGHT HAND   Final   Special Requests BOTTLES DRAWN AEROBIC ONLY 1.5CC   Final   Culture  Setup Time     Final   Value: 10/12/2013 08:30     Performed at [facility]   Culture     Final   Value:        BLOOD CULTURE RECEIVED NO GROWTH TO DATE CULTURE WILL BE HELD  FOR 5 DAYS BEFORE ISSUING A FINAL NEGATIVE REPORT     Performed at [facility]   Report Status PENDING   Incomplete  CULTURE, BLOOD (ROUTINE X 2)     Status: None   Collection Time    10/12/13  5:42 AM      Result Value Ref Range Status   Specimen Description BLOOD LEFT HAND   Final   Special Requests BOTTLES DRAWN AEROBIC ONLY 4CC   Final   Culture  Setup Time     Final   Value: 10/12/2013 08:31     Performed at [facility]   Culture     Final   Value:        BLOOD CULTURE RECEIVED NO GROWTH TO DATE CULTURE WILL BE HELD FOR 5 DAYS BEFORE ISSUING A FINAL NEGATIVE REPORT     Performed at [facility]   Report Status PENDING   Incomplete  CULTURE, RESPIRATORY (NON-EXPECTORATED)     Status: None   Collection Time    10/12/13  3:00 PM      Result Value Ref Range Status   Specimen Description ENDOTRACHEAL   Final   Special Requests Normal   Final   Gram Stain     Final   Value: ABUNDANT WBC PRESENT,BOTH PMN AND MONONUCLEAR     RARE SQUAMOUS EPITHELIAL CELLS PRESENT     MODERATE GRAM POSITIVE COCCI     IN PAIRS IN CLUSTERS     Performed at [facility]    Culture     Final   Value: Non-Pathogenic Oropharyngeal-type Flora Isolated.     Performed at [facility]   Report Status PENDING   Incomplete    Medical History: Past Medical History  Diagnosis Date  . Cancer 2002    colon  . Stroke     Medications:  Scheduled:  . antiseptic oral rinse  15 mL Mouth Rinse QID  . aspirin  325 mg Oral Daily  . cefTAZidime (FORTAZ)  IV  1 g Intravenous Q12H  . chlorhexidine  15 mL Mouth Rinse BID  . Chlorhexidine Gluconate Cloth  6 each Topical Q0600  . cholecalciferol  2,000 Units Oral Daily  . famotidine (PEPCID) IV  20 mg Intravenous Q12H  . insulin aspart  0-9 Units Subcutaneous 6 times per day  . levETIRAcetam  1,000 mg Intravenous Q12H  . mupirocin ointment  1 application Nasal BID  . oxyCODONE-acetaminophen  1 tablet Oral Once   And  . oxyCODONE  5 mg Oral Once  . pantoprazole (PROTONIX) IV  40 mg Intravenous Q24H  . phenytoin (DILANTIN) IV  100 mg Intravenous 3 times per day  . potassium chloride  10 mEq Intravenous Q1 Hr x 4  . rocuronium  1 mg/kg Intravenous Once  . simvastatin  20 mg Oral q1800  . Warfarin - Pharmacist Dosing Inpatient   Does not apply q1800   Assessment: 73 yo male with VDRF, possible PNA, for empiric antibiotics  Goal of Therapy:  Vancomycin trough level 15-20 mcg/ml  Plan:  Vancomycin 1500 mg IV now, then vancomycin 750 mg IV q12h Cefepime 1 g IV q8h Levaquin 750 mg IV q24h  Bronson Curb Laterra Lubinski 10/14/2013,6:48 AM

## 2013-10-14 NOTE — Progress Notes (Signed)
Echo Lab  2D Echocardiogram completed.  [place], RDCS 10/14/2013 11:23 AM

## 2013-10-15 ENCOUNTER — Inpatient Hospital Stay (HOSPITAL_COMMUNITY): Payer: Medicare Other

## 2013-10-15 DIAGNOSIS — E872 Acidosis, unspecified: Secondary | ICD-10-CM

## 2013-10-15 DIAGNOSIS — G40909 Epilepsy, unspecified, not intractable, without status epilepticus: Secondary | ICD-10-CM

## 2013-10-15 DIAGNOSIS — I428 Other cardiomyopathies: Secondary | ICD-10-CM

## 2013-10-15 LAB — COMPREHENSIVE METABOLIC PANEL
ALT: 11 U/L (ref 0–53)
AST: 19 U/L (ref 0–37)
Albumin: 2.6 g/dL — ABNORMAL LOW (ref 3.5–5.2)
Alkaline Phosphatase: 37 U/L — ABNORMAL LOW (ref 39–117)
BUN: 11 mg/dL (ref 6–23)
CALCIUM: 7.7 mg/dL — AB (ref 8.4–10.5)
CO2: 21 meq/L (ref 19–32)
Chloride: 108 mEq/L (ref 96–112)
Creatinine, Ser: 1.11 mg/dL (ref 0.50–1.35)
GFR, EST AFRICAN AMERICAN: 75 mL/min — AB (ref >90)
GFR, EST NON AFRICAN AMERICAN: 65 mL/min — AB (ref >90)
GLUCOSE: 117 mg/dL — AB (ref 70–99)
POTASSIUM: 3.5 meq/L — AB (ref 3.7–5.3)
Sodium: 143 mEq/L (ref 137–147)
Total Bilirubin: 0.2 mg/dL — ABNORMAL LOW (ref 0.3–1.2)
Total Protein: 5.8 g/dL — ABNORMAL LOW (ref 6.0–8.3)

## 2013-10-15 LAB — GLUCOSE, CAPILLARY
Glucose-Capillary: 102 mg/dL — ABNORMAL HIGH (ref 70–99)
Glucose-Capillary: 105 mg/dL — ABNORMAL HIGH (ref 70–99)
Glucose-Capillary: 116 mg/dL — ABNORMAL HIGH (ref 70–99)
Glucose-Capillary: 117 mg/dL — ABNORMAL HIGH (ref 70–99)
Glucose-Capillary: 119 mg/dL — ABNORMAL HIGH (ref 70–99)
Glucose-Capillary: 92 mg/dL (ref 70–99)

## 2013-10-15 LAB — CBC
HCT: 28.1 % — ABNORMAL LOW (ref 39.0–52.0)
HEMOGLOBIN: 9.8 g/dL — AB (ref 13.0–17.0)
MCH: 29.6 pg (ref 26.0–34.0)
MCHC: 34.9 g/dL (ref 30.0–36.0)
MCV: 84.9 fL (ref 78.0–100.0)
Platelets: 259 10*3/uL (ref 150–400)
RBC: 3.31 MIL/uL — AB (ref 4.22–5.81)
RDW: 17.9 % — ABNORMAL HIGH (ref 11.5–15.5)
WBC: 9.7 10*3/uL (ref 4.0–10.5)

## 2013-10-15 LAB — PROTIME-INR
INR: 3.95 — ABNORMAL HIGH (ref 0.00–1.49)
Prothrombin Time: 37.1 seconds — ABNORMAL HIGH (ref 11.6–15.2)

## 2013-10-15 LAB — PHENYTOIN LEVEL, TOTAL: Phenytoin Lvl: 15.8 ug/mL (ref 10.0–20.0)

## 2013-10-15 LAB — PRO B NATRIURETIC PEPTIDE: Pro B Natriuretic peptide (BNP): 24975 pg/mL — ABNORMAL HIGH (ref 0–125)

## 2013-10-15 LAB — TROPONIN I: Troponin I: 0.84 ng/mL (ref <0.30)

## 2013-10-15 MED ORDER — NITROGLYCERIN IN D5W 200-5 MCG/ML-% IV SOLN
5.0000 ug/min | INTRAVENOUS | Status: DC
Start: 2013-10-15 — End: 2013-10-16
  Administered 2013-10-15: 5 ug/min via INTRAVENOUS
  Filled 2013-10-15: qty 250

## 2013-10-15 MED ORDER — PROPOFOL 10 MG/ML IV EMUL: 5.0000 ug/kg/min | INTRAVENOUS | Status: AC

## 2013-10-15 MED ORDER — SIMVASTATIN 20 MG PO TABS
20.0000 mg | ORAL_TABLET | Freq: Every day | ORAL | Status: DC
  Administered 2013-10-16: 20 mg via ORAL
  Filled 2013-10-15 (×3): qty 1

## 2013-10-15 MED ORDER — ASPIRIN 325 MG PO TABS
325.0000 mg | ORAL_TABLET | Freq: Every day | ORAL | Status: DC
  Administered 2013-10-16 – 2013-10-18 (×3): 325 mg via ORAL
  Filled 2013-10-15 (×3): qty 1

## 2013-10-15 MED ORDER — POTASSIUM CHLORIDE 20 MEQ/15ML (10%) PO LIQD
40.0000 meq | Freq: Once | ORAL | Status: DC
  Filled 2013-10-15: qty 30

## 2013-10-15 MED ORDER — ACETAMINOPHEN 325 MG PO TABS
650.0000 mg | ORAL_TABLET | ORAL | Status: DC | PRN
  Administered 2013-10-15 – 2013-10-18 (×3): 650 mg via ORAL
  Filled 2013-10-15 (×3): qty 2

## 2013-10-15 MED ORDER — FUROSEMIDE 10 MG/ML IJ SOLN
40.0000 mg | Freq: Once | INTRAMUSCULAR | Status: AC
  Administered 2013-10-15: 40 mg via INTRAVENOUS
  Filled 2013-10-15: qty 4

## 2013-10-15 MED ORDER — POTASSIUM CHLORIDE 10 MEQ/50ML IV SOLN
10.0000 meq | INTRAVENOUS | Status: AC
  Administered 2013-10-15 (×4): 10 meq via INTRAVENOUS
  Filled 2013-10-15 (×4): qty 50

## 2013-10-15 NOTE — Progress Notes (Signed)
PULMONARY / CRITICAL CARE MEDICINE   Name: [name] [name] MRN: [id number] DOB: [birth year]    ADMISSION DATE:  10/11/2013 CONSULTATION DATE:  10/12/2013  REFERRING MD :  ED PRIMARY SERVICE: PCCM  CHIEF COMPLAINT:  Seizures  BRIEF PATIENT DESCRIPTION: [name] [name] is a 73 y.o. 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    SIGNIFICANT EVENTS / STUDIES:  4/30 CT head: No acute process; large remote R MCA infarct 4/30 Admit with seizures 4/30 pCXR: Interstitial edema  4/30 lactic up, abx started, line placed, bolus given 5/01 Extubated 5/02 Required re-intubation. Edema pattern on CXR 5/03 CXR much improved. Extubated on NTG gtt. Advanced directives discussion with pt after extubation. Made DNR/I  LINES / TUBES: ETT 4/30 >> 5/01, 5/02 >> 5/03 Left IJ CVL 4/30>>>  CULTURES: MRSA PCR 4/29>>  BC 4/30>> UA 4/30>> UC 4/30>>50 k yeast   ANTIBIOTICS: Anti-infectives   Start     Dose/Rate Route Frequency Ordered Stop   10/14/13 1800  vancomycin (VANCOCIN) IVPB 750 mg/150 ml premix  Status:  Discontinued     750 mg 150 mL/hr over 60 Minutes Intravenous Every 12 hours 10/14/13 0659 10/14/13 1016   10/14/13 1000  levofloxacin (LEVAQUIN) IVPB 750 mg  Status:  Discontinued     750 mg 100 mL/hr over 90 Minutes Intravenous Every 24 hours 10/14/13 0659 10/14/13 1016   10/14/13 0800  vancomycin (VANCOCIN) 1,500 mg in sodium chloride 0.9 % 500 mL IVPB     1,500 mg 250 mL/hr over 120 Minutes Intravenous  Once 10/14/13 0659 10/14/13 1053   10/14/13 0800  ceFEPIme (MAXIPIME) 1 g in dextrose 5 % 50 mL IVPB  Status:  Discontinued     1 g 100 mL/hr over 30 Minutes Intravenous 3 times per day 10/14/13 0659 10/14/13 1016   10/12/13 1430  cefTAZidime (FORTAZ) 1 g in dextrose 5 % 50 mL IVPB  Status:  Discontinued     1 g 100 mL/hr over 30 Minutes Intravenous Every 12 hours 10/12/13 1413 10/14/13  0658      SUBJ:  Passed SBT, extubated with NTG gtt. Tolerating well initially. Cognition intact. No complaints. Very weak  VITAL SIGNS: Temp:  [99.1 F (37.3 C)-99.8 F (37.7 C)] 99.6 F (37.6 C) (05/03 1200) Pulse Rate:  [77-95] 95 (05/03 1900) Resp:  [14-23] 19 (05/03 1900) BP: (104-126)/(55-83) 118/62 mmHg (05/03 1900) SpO2:  [97 %-100 %] 98 % (05/03 1900) FiO2 (%):  [30 %-40 %] 30 % (05/03 1400) Weight:  [77 kg (169 lb 12.1 oz)] 77 kg (169 lb 12.1 oz) (05/03 0500)  HEMODYNAMICS:   VENTILATOR SETTINGS: Vent Mode:  [-] PSV;CPAP FiO2 (%):  [30 %-40 %] 30 % Set Rate:  [15 bmp] 15 bmp Vt Set:  [500 mL] 500 mL PEEP:  [5 cmH20] 5 cmH20 Pressure Support:  [5 cmH20] 5 cmH20 Plateau Pressure:  [11 cmH20-14 cmH20] 14 cmH20  INTAKE / OUTPUT: Intake/Output     05/03 0701 - 05/04 0700   I.V. (mL/kg) 1058.2 (13.7)   NG/GT    IV Piggyback 530   Total Intake(mL/kg) 1588.2 (20.6)   Urine (mL/kg/hr) 3075 (3.1)   Total Output 3075   Net -1486.8       Urine Occurrence 1 x    PHYSICAL EXAMINATION: General:  NAD Neuro: Diffusely weak HEENT: WNL Cardiovascular: RRR s M Chest/Lungs: No wheezes Abdomen:  +bs, soft, non-distended; urostomy and colostomy present Ext:  Warm, without edema  LABS: I have reviewed all of today's lab results. Relevant abnormalities are discussed in the A/P section  CBC  Recent Labs Lab 10/13/13 0500 10/14/13 0400 10/15/13 0551  WBC 9.0 10.6* 9.7  HGB 9.3* 9.9* 9.8*  HCT 27.0* 28.4* 28.1*  PLT 227 255 259   Coag's  Recent Labs Lab 10/13/13 0530 10/14/13 0400 10/15/13 0551  INR 3.36* 2.85* 3.95*   BMET  Recent Labs Lab 10/14/13 0400 10/14/13 0828 10/15/13 0551  NA 142 143 143  K 3.3* 3.8 3.5*  CL 111 109 108  CO2 16* 13* 21  BUN _0 CREATININE 1.14 1.27 1.11  GLUCOSE 92 208* 117*   Electrolytes  Recent Labs Lab 10/11/13 2252 10/12/13 0348  10/13/13 0500  10/14/13 0400 10/14/13 0828 10/15/13 0551  CALCIUM  9.3 8.7  < > 7.8*  < > 8.2* 8.2* 7.7*  MG 1.7 2.0  --   --   --   --   --   --   PHOS 3.9  --   --  2.3  --   --   --   --   < > = values in this interval not displayed. Sepsis Markers  Recent Labs Lab 10/12/13 0150 10/12/13 1105 10/12/13 1450 10/13/13 0500 10/14/13 0400 10/14/13 0843  LATICACIDVEN 1.8 3.1*  --   --   --  2.6*  PROCALCITON  --   --  <0.10 0.20 <0.10  --    ABG  Recent Labs Lab 10/13/13 2059 10/14/13 0550 10/14/13 0820  PHART 7.458* 7.266* 7.289*  PCO2ART 23.2* 21.0* 29.8*  PO2ART 67.1* 83.8 216.0*   Liver Enzymes  Recent Labs Lab 10/11/13 2252 10/13/13 0500 10/15/13 0551  AST _1 ALT _2 ALKPHOS 52 35* 37*  BILITOT 0.4 <0.2* 0.2*  ALBUMIN 3.7 2.6* 2.6*   Cardiac Enzymes  Recent Labs Lab 10/12/13 0348  10/14/13 0828 10/14/13 1245 10/14/13 1845 10/15/13 0552  TROPONINI 0.43*  < > 1.07* 1.60* 1.34* 0.84*  PROBNP 2369.0*  --  28561.0*  --   --  24975.0*  < > = values in this interval not displayed. Glucose  Recent Labs Lab 10/14/13 2011 10/14/13 2335 10/15/13 0327 10/15/13 0813 10/15/13 1215 10/15/13 1603  GLUCAP 112* 119* 102* 92 117* 116*    Imaging Dg Chest Port 1 View  10/15/2013   CLINICAL DATA:  Respiratory failure  EXAM: PORTABLE CHEST - 1 VIEW  COMPARISON:  10/14/2013  FINDINGS: The ET tube tip is above the carina. There is a left IJ catheter with tip in the projection of the SVC. Nasogastric tube tip is in the stomach. The heart size and mediastinal contours are within normal limits. There has been interval improvement in pulmonary edema. The visualized skeletal structures are unremarkable.  IMPRESSION: 1. Satisfactory stress set stable support apparatus. 2. Decrease in pulmonary edema.   Electronically Signed   By: Kerby Moors M.D.   On: 10/15/2013 08:00   Dg Chest Port 1 View  10/14/2013   CLINICAL DATA:  Assess edema.  EXAM: PORTABLE CHEST - 1 VIEW  COMPARISON:  10/13/2013  FINDINGS: Sequelae of prior CABG are  again identified. Cardiac silhouette is mildly enlarged. Endotracheal and enteric tubes have been removed. Left jugular central venous catheter remains in place with tip overlying the upper SVC. Pulmonary vasculature is indistinct and there are increased patchy perihilar opacities. No definite pleural effusion or pneumothorax is identified.  IMPRESSION: 1. Interval  extubation. 2. Worsening pulmonary edema.   Electronically Signed   By: Logan Bores   On: 10/14/2013 08:17   Dg Chest Port 1 View  10/14/2013   CLINICAL DATA:  Intubation  EXAM: PORTABLE CHEST - 1 VIEW  COMPARISON:  10/14/2013  FINDINGS: The ET tube tip is above the carina. There is a left IJ catheter with tip in the projection of the SVC. The heart size appears normal. Moderate diffuse pulmonary edema is unchanged from previous exam.  IMPRESSION: 1. Interval intubation with tip above the carina. 2. No change in pulmonary edema   Electronically Signed   By: Kerby Moors M.D.   On: 10/14/2013 07:47   Dg Abd Portable 1v  10/14/2013   CLINICAL DATA:  NG tube placement.  EXAM: PORTABLE ABDOMEN - 1 VIEW  COMPARISON:  10/12/2013  FINDINGS: Enteric tube is present with side hole overlying the gastric body and tip in the region of the distal stomach. Gas is present in nondilated loops of bowel. Calcification /residual oral contrast material in the right mid abdomen is unchanged. Surgical clips are present in the upper abdomen and pelvis. No acute osseous abnormality is identified. Left lower quadrant ostomy is noted.  IMPRESSION: Enteric tube as above with tip in the region of the distal stomach.   Electronically Signed   By: Logan Bores   On: 10/14/2013 12:25   ASSESSMENT / PLAN:  PULMONARY A:  Acute resp failure Pulmonary edema uncompensated met acidosis - improved on HCO3 gtt Failed extubation 5/01 Do not re-intubate P: Supplemental O2 as needed Cont NTG to prevent recurrent edema  Transition to topical soon Received Lasix X 1  5/03  CARDIOVASCULAR A:  - hx of CAD, s/p CABG - hx of PAF. On Coumadin, but INR subtherapeutic -Elevated trop I - NSTEMI Severe dilated cardiomyopathy P:  Not candidate for invasive eval presently Cont NTG gtt Consider Cards eval (?HF team) 5/04  RENAL A:   - hx of bladder cancer-->s/p of urostomy - hx of CKD-III with BL cre 1.33 to 2.34. creatinine slightly elevated from 1.33 on 3/24-->1.66, but seems be to in his baseline range - NON AG acidosis P:   Monitor BMET intermittently Monitor I/Os Correct electrolytes as indicated HCO3 infusion adjusted  ?transition to oral bicarb 5/04  GASTROINTESTINAL A:   - hx of GERD - hx of colon cancer-->s/p of colostomy, no signs of infection around the colostomy site.  - Colon cancer with resection (2001), metastatic colon cancer (2002) involving bladder with cystectomy and urostomy tubes and colostomy. H/o cdiff  P:   - IV Pepcid - start TF, hold for weaning  -limit abx with h;o cdiff when able  HEMATOLOGIC A:   Mild anemia P:  DVT px: SQ heparin Monitor CBC intermittently Transfuse per usual ICU guidelines  INFECTIOUS A:   No overt infection identified Normal PCT P:   Micro and abx as above   NEUROLOGIC A:  Seizure D/O H/O CVA P: Cont AEDs  Discussed in detail with family prior to extubation and shared new finding of severe LV impairment. Discussed with pt after extubation. Advanced directives considered. He knows that a third intubation would be an indication for trach tube placement which he adamantly opposes. He understands that his cardiomyopathy is severe and, while treatable, is likely to leave him significantly and permanently impaired. He wishes to be full DNR and DNI   CCM X 41 mins  Merton Border, MD ; [facility] [phone].  After 5:30  PM or weekends, call [phone]

## 2013-10-15 NOTE — Progress Notes (Signed)
Placed pt. to wean @ this time, 5p/5ps/30% fi02, Vt-554/rr-26/mv-9.0/RSBI-35, staff working with pt. earlier, currently alert & tolerating well, able to lift head of bed with hold, (+) cuff leak, NIF-(-20)cmh20, FVC-(1.5)L, RN made aware.

## 2013-10-15 NOTE — Procedures (Signed)
Extubation Procedure Note  Patient Details:   Name: Fran Mcree DOB: 06/21/1940 MRN: [id number]   Airway Documentation:     Evaluation  O2 sats: stable throughout Complications: No apparent complications Patient did tolerate procedure well. Bilateral Breath Sounds: Clear Suctioning: Airway Yes Pt. extubated w/o difficulty per order, RN @ bedside, (+) cuff leak, FVC-(1.4)L, NIF-(-20)cmh20 able to lift head off bed on own, placed on 4 lpm n/c humidified 02, plan to start I/S asap, no post extubation Stridor noted, RT to monitor. Dia Crawford 10/15/2013, 2:16 PM

## 2013-10-15 NOTE — Progress Notes (Signed)
ANTICOAGULATION CONSULT NOTE - Follow Up Consult  Pharmacy Consult for Coumadin Indication: atrial fibrillation  No Known Allergies  Patient Measurements: Height: 6\' 2"  (188 cm) Weight: 169 lb 12.1 oz (77 kg) IBW/kg (Calculated) : 82.2  Vital Signs: Temp: 99.8 F (37.7 C) (05/03 0814) Temp src: Axillary (05/03 0814) BP: 122/67 mmHg (05/03 0900) Pulse Rate: 79 (05/03 0600)  Labs:  Recent Labs  10/13/13 0500 10/13/13 0530  10/14/13 0400 10/14/13 0828 10/14/13 1245 10/14/13 1845 10/15/13 0551 10/15/13 0552  HGB 9.3*  --   --  9.9*  --   --   --  9.8*  --   HCT 27.0*  --   --  28.4*  --   --   --  28.1*  --   PLT 227  --   --  255  --   --   --  259  --   LABPROT  --  32.8*  --  28.9*  --   --   --  37.1*  --   INR  --  3.36*  --  2.85*  --   --   --  3.95*  --   CREATININE 1.32  --   < > 1.14 1.27  --   --  1.11  --   TROPONINI  --   --   < >  --  1.07* 1.60* 1.34*  --  0.84*  < > = values in this interval not displayed.  Estimated Creatinine Clearance: 66.5 ml/min (by C-G formula based on Cr of 1.11).   Medications:  Scheduled:  . antiseptic oral rinse  15 mL Mouth Rinse QID  . aspirin  325 mg Per Tube Daily  . chlorhexidine  15 mL Mouth Rinse BID  . Chlorhexidine Gluconate Cloth  6 each Topical Q0600  . insulin aspart  0-9 Units Subcutaneous 6 times per day  . levETIRAcetam  1,000 mg Intravenous Q12H  . mupirocin ointment  1 application Nasal BID  . pantoprazole (PROTONIX) IV  40 mg Intravenous Q24H  . phenytoin (DILANTIN) IV  100 mg Intravenous 3 times per day  . rocuronium  1 mg/kg Intravenous Once  . simvastatin  20 mg Per Tube q1800  . Warfarin - Pharmacist Dosing Inpatient   Does not apply q1800    Assessment: 73 yo M on Coumadin PTA for PAF.  INR with quick jump up to 3.95.  No bleeding or complications noted.   PTA = 2mg  qday except 4mg  MWF  Goal of Therapy:  INR 2-3 Monitor platelets by anticoagulation protocol: Yes   Plan:  1. Hold Coumadin  today. 2. Continue daily PT/INR.  Uvaldo Rising, BCPS  Clinical Pharmacist Pager [phone]  10/15/2013 1:21 PM

## 2013-10-15 NOTE — Progress Notes (Signed)
Subjective: No recurrence of seizure activity reported. He was reintubated following extubation 2 days ago and has remained intubated.  Objective: Current vital signs: BP 126/69  Pulse 79  Temp(Src) 99.8 F (37.7 C) (Axillary)  Resp 16  Ht 6\' 2"  (1.88 m)  Wt 77 kg (169 lb 12.1 oz)  BMI 21.79 kg/m2  SpO2 100%  Neurologic Exam: Patient was lethargic but responsive and was able to follow simple commands. Extraocular movements were full and conjugate on right and left gaze. No facial weakness was noted. Patient was able to move all extremities appropriately to command. Weakness of left extremities from previous stroke and residual hemiparesis noted.  Medications: I have reviewed the patient's current medications.  Assessment/Plan: Seizure disorder with recurrent breakthrough seizures at the time of admission, as well as previous right MCA territory stroke. Seizures are controlled at this point with Keppra 1000 mg every 12 hours and Dilantin 100 mg every 8 hours.  I will check Dilantin level today and adjust dose if indicated.  I will plan to see him in followup on an as-needed basis following this visit.  C.R. Nicole Kindred, MD [facility] [phone]  10/15/2013  9:18 AM

## 2013-10-16 ENCOUNTER — Inpatient Hospital Stay (HOSPITAL_COMMUNITY): Payer: Medicare Other

## 2013-10-16 DIAGNOSIS — I25810 Atherosclerosis of coronary artery bypass graft(s) without angina pectoris: Secondary | ICD-10-CM

## 2013-10-16 LAB — CBC
HCT: 29.1 % — ABNORMAL LOW (ref 39.0–52.0)
Hemoglobin: 10.1 g/dL — ABNORMAL LOW (ref 13.0–17.0)
MCH: 29.7 pg (ref 26.0–34.0)
MCHC: 34.7 g/dL (ref 30.0–36.0)
MCV: 85.6 fL (ref 78.0–100.0)
Platelets: 291 10*3/uL (ref 150–400)
RBC: 3.4 MIL/uL — ABNORMAL LOW (ref 4.22–5.81)
RDW: 17.4 % — ABNORMAL HIGH (ref 11.5–15.5)
WBC: 10.5 10*3/uL (ref 4.0–10.5)

## 2013-10-16 LAB — PROTIME-INR
INR: 3.65 — ABNORMAL HIGH (ref 0.00–1.49)
PROTHROMBIN TIME: 34.9 s — AB (ref 11.6–15.2)

## 2013-10-16 LAB — BASIC METABOLIC PANEL
BUN: 9 mg/dL (ref 6–23)
CHLORIDE: 105 meq/L (ref 96–112)
CO2: 28 meq/L (ref 19–32)
Calcium: 8 mg/dL — ABNORMAL LOW (ref 8.4–10.5)
Creatinine, Ser: 1.06 mg/dL (ref 0.50–1.35)
GFR calc non Af Amer: 69 mL/min — ABNORMAL LOW (ref >90)
GFR, EST AFRICAN AMERICAN: 80 mL/min — AB (ref >90)
Glucose, Bld: 103 mg/dL — ABNORMAL HIGH (ref 70–99)
POTASSIUM: 2.7 meq/L — AB (ref 3.7–5.3)
Sodium: 144 mEq/L (ref 137–147)

## 2013-10-16 LAB — GLUCOSE, CAPILLARY: GLUCOSE-CAPILLARY: 112 mg/dL — AB (ref 70–99)

## 2013-10-16 LAB — PHENYTOIN LEVEL, TOTAL: Phenytoin Lvl: 16 ug/mL (ref 10.0–20.0)

## 2013-10-16 MED ORDER — POTASSIUM CHLORIDE 10 MEQ/50ML IV SOLN
10.0000 meq | INTRAVENOUS | Status: AC
  Administered 2013-10-16 (×4): 10 meq via INTRAVENOUS
  Filled 2013-10-16 (×3): qty 50

## 2013-10-16 MED ORDER — SODIUM BICARBONATE 650 MG PO TABS
650.0000 mg | ORAL_TABLET | Freq: Three times a day (TID) | ORAL | Status: DC
  Administered 2013-10-16 – 2013-10-18 (×7): 650 mg via ORAL
  Filled 2013-10-16 (×10): qty 1

## 2013-10-16 MED ORDER — METOPROLOL TARTRATE 1 MG/ML IV SOLN
2.5000 mg | INTRAVENOUS | Status: DC | PRN
  Administered 2013-10-16 (×2): 5 mg via INTRAVENOUS
  Administered 2013-10-18: 2.5 mg via INTRAVENOUS
  Administered 2013-10-18: 5 mg via INTRAVENOUS
  Filled 2013-10-16 (×3): qty 5

## 2013-10-16 MED ORDER — POTASSIUM CHLORIDE CRYS ER 20 MEQ PO TBCR
40.0000 meq | EXTENDED_RELEASE_TABLET | Freq: Three times a day (TID) | ORAL | Status: AC
  Administered 2013-10-16 (×2): 40 meq via ORAL
  Filled 2013-10-16 (×2): qty 2

## 2013-10-16 MED ORDER — DILTIAZEM HCL 60 MG PO TABS
60.0000 mg | ORAL_TABLET | Freq: Two times a day (BID) | ORAL | Status: DC
  Administered 2013-10-16 – 2013-10-18 (×6): 60 mg via ORAL
  Filled 2013-10-16 (×8): qty 1

## 2013-10-16 MED ORDER — NITROGLYCERIN 0.1 MG/HR TD PT24
0.1000 mg | MEDICATED_PATCH | Freq: Every day | TRANSDERMAL | Status: DC
  Administered 2013-10-16 – 2013-10-18 (×3): 0.1 mg via TRANSDERMAL
  Filled 2013-10-16 (×3): qty 1

## 2013-10-16 MED ORDER — OXYCODONE HCL 5 MG PO TABS
10.0000 mg | ORAL_TABLET | ORAL | Status: DC | PRN
  Administered 2013-10-16 – 2013-10-19 (×9): 10 mg via ORAL
  Filled 2013-10-16 (×9): qty 2

## 2013-10-16 MED ORDER — STARCH (THICKENING) PO POWD
ORAL | Status: DC | PRN
  Filled 2013-10-16: qty 227

## 2013-10-16 MED ORDER — ENSURE COMPLETE PO LIQD
237.0000 mL | Freq: Two times a day (BID) | ORAL | Status: DC
  Administered 2013-10-16 – 2013-10-19 (×7): 237 mL via ORAL

## 2013-10-16 MED ORDER — OXYCODONE HCL ER 10 MG PO T12A
20.0000 mg | EXTENDED_RELEASE_TABLET | Freq: Two times a day (BID) | ORAL | Status: DC
  Administered 2013-10-16 – 2013-10-19 (×6): 20 mg via ORAL
  Filled 2013-10-16 (×6): qty 2

## 2013-10-16 NOTE — Progress Notes (Signed)
eLink Physician-Brief Progress Note Patient Name: Tayt Moyers DOB: August 03, 1940 MRN: [id number]  Date of Service  10/16/2013   HPI/Events of Note  AF-RVR   eICU Interventions  Lopressor prn, consider restart coreg Resume cardizem 60 q 12h OK to dc NTG   Intervention Category Major Interventions: Arrhythmia - evaluation and management  Rigoberto Noel 10/16/2013, 1:18 AM

## 2013-10-16 NOTE — Progress Notes (Signed)
Subjective: No recurrent seizure activity reported. Patient has been extubated and has remained stable. He had no complaints.  Objective: Current vital signs: BP 106/59  Pulse 89  Temp(Src) 98 F (36.7 C) (Oral)  Resp 27  Ht 6\' 2"  (1.88 m)  Wt 72 kg (158 lb 11.7 oz)  BMI 20.37 kg/m2  SpO2 100%  Neurologic Exam: Alert and in no acute distress. Patient was slightly disoriented to place but was otherwise unremarkable. His ocular movements were full and conjugate. Left visual field defect noted. No facial weakness noted. Mild left hemiparesis from previous stroke noted.  Medications: I have reviewed the patient's current medications.  Assessment/Plan: Seizure disorder with breakthrough seizure activity at time of admission, now controlled with Keppra and Dilantin. His EEG did not show any signs of ongoing seizure activity.  No further neurological intervention is indicated acutely at this point. I will plan to see him in in followup on an as-needed basis following this visit. He will need ongoing outpatient neurology followup following discharge.  C.R. Nicole Kindred, MD [facility] [phone]  10/16/2013  9:34 AM

## 2013-10-16 NOTE — Progress Notes (Signed)
[facility] Progress Note Patient Name: [name] [name] DOB: Jan 22, 1941 MRN: [id number]  Date of Service  10/16/2013   HPI/Events of Note     eICU Interventions  Hypokalemia -repleted    Intervention Category Intermediate Interventions: Electrolyte abnormality - evaluation and management  Rigoberto Noel 10/16/2013, 5:08 AM

## 2013-10-16 NOTE — Progress Notes (Signed)
[facility] Progress Note Patient Name: [name] [name] DOB: [birth year] MRN: [id number]  Date of Service  10/16/2013   HPI/Events of Note   Hx of chronic pain syndrome. On oxycontin and Oxy IR at home  eICU Interventions  Reordered oxycontin/oxy IR but at lowered dose    Intervention Category Intermediate Interventions: Pain - evaluation and management  Elsie Stain 10/16/2013, 9:22 PM

## 2013-10-16 NOTE — Progress Notes (Signed)
ANTICOAGULATION CONSULT NOTE - Follow Up Consult  Pharmacy Consult for Coumadin Indication: atrial fibrillation  No Known Allergies  Patient Measurements: Height: 6\' 2"  (188 cm) Weight: 158 lb 11.7 oz (72 kg) IBW/kg (Calculated) : 82.2  Vital Signs: Temp: 98 F (36.7 C) (05/04 0802) Temp src: Oral (05/04 0802) BP: 102/76 mmHg (05/04 0600) Pulse Rate: 131 (05/04 0600)  Labs:  Recent Labs  10/14/13 0400 10/14/13 0828 10/14/13 1245 10/14/13 1845 10/15/13 0551 10/15/13 0552 10/16/13 0420  HGB 9.9*  --   --   --  9.8*  --  10.1*  HCT 28.4*  --   --   --  28.1*  --  29.1*  PLT 255  --   --   --  259  --  291  LABPROT 28.9*  --   --   --  37.1*  --  34.9*  INR 2.85*  --   --   --  3.95*  --  3.65*  CREATININE 1.14 1.27  --   --  1.11  --  1.06  TROPONINI  --  1.07* 1.60* 1.34*  --  0.84*  --     Estimated Creatinine Clearance: 65.1 ml/min (by C-G formula based on Cr of 1.06).   Medications:  Scheduled:  . antiseptic oral rinse  15 mL Mouth Rinse QID  . aspirin  325 mg Oral Daily  . chlorhexidine  15 mL Mouth Rinse BID  . Chlorhexidine Gluconate Cloth  6 each Topical Q0600  . diltiazem  60 mg Oral Q12H  . levETIRAcetam  1,000 mg Intravenous Q12H  . mupirocin ointment  1 application Nasal BID  . pantoprazole (PROTONIX) IV  40 mg Intravenous Q24H  . potassium chloride  10 mEq Intravenous Q1 Hr x 4  . simvastatin  20 mg Oral q1800  . Warfarin - Pharmacist Dosing Inpatient   Does not apply q1800    Assessment: 73 yo M on Coumadin PTA for PAF. No coumadin yesterday d/t supratherapeutic INR, INR is still 3.65 this morning. Pt. Is NPO, CBC is stable.  No bleeding or complications noted.   PTA = 2mg  qday except 4mg  MWF  Goal of Therapy:  INR 2-3 Monitor platelets by anticoagulation protocol: Yes   Plan:  1. Hold Coumadin today. 2. Continue daily PT/INR.  Maryanna Shape, PharmD, BCPS  Clinical Pharmacist  Pager: [phone]   10/16/2013 8:34 AM    __

## 2013-10-16 NOTE — Evaluation (Signed)
Physical Therapy Evaluation Patient Details Name: [name] [name] MRN: [id number] DOB: 09-11-1940 Today's Date: 10/16/2013   History of Present Illness  [name] [name] is a 73 y.o. 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   Clinical Impression  Pt admitted with seizures. Pt currently with functional limitations due to the deficits listed below (see PT Problem List).  Pt will benefit from skilled PT to increase their independence and safety with mobility to allow discharge to the venue listed below.     Follow Up Recommendations SNF    Equipment Recommendations  None recommended by PT    Recommendations for Other Services       Precautions / Restrictions Precautions Precautions: Fall;Other (comment) (urostomy and colostomy bags) Restrictions Weight Bearing Restrictions: No      Mobility  Bed Mobility Overal bed mobility: Needs Assistance Bed Mobility: Supine to Sit;Sit to Supine     Supine to sit: Min assist;HOB elevated Sit to supine: Min assist;HOB elevated   General bed mobility comments: overall min A for bed mobility, A needed for lower extremities to return to supine  Transfers Overall transfer level: Needs assistance Equipment used: Rolling walker (2 wheeled) Transfers: Sit to/from Stand Sit to Stand: Mod assist         General transfer comment: mod A to initiate stand; max cues for correct hand placement however pt. not following instructions. Upon standing pt. requested to return to sitting and refused any further mobility.  Ambulation/Gait                Stairs            Wheelchair Mobility    Modified Rankin (Stroke Patients Only)       Balance Overall balance assessment: Needs assistance Sitting-balance support: Single extremity supported;Feet supported Sitting balance-Leahy Scale: Fair   Postural control: Right lateral  lean Standing balance support: During functional activity;Bilateral upper extremity supported Standing balance-Leahy Scale: Poor                               Pertinent Vitals/Pain No c/o pain    Home Living Family/patient expects to be discharged to:: Private residence Living Arrangements: Children Available Help at Discharge: Family;Available 24 hours/day Type of Home: House Home Access: Stairs to enter Entrance Stairs-Rails: Right Entrance Stairs-Number of Steps: 2 Home Layout: One level Home Equipment: Cane - quad      Prior Function Level of Independence: Needs assistance   Gait / Transfers Assistance Needed: supervision with quad cane  ADL's / Homemaking Assistance Needed: pt. reports he performs ADLs independently        Hand Dominance   Dominant Hand: Right    Extremity/Trunk Assessment   Upper Extremity Assessment: Defer to OT evaluation           Lower Extremity Assessment: Generalized weakness;LLE deficits/detail   LLE Deficits / Details: residual LLE weakness from CVA 06/2013; grossly 3/5     Communication   Communication: No difficulties  Cognition Arousal/Alertness: Awake/alert Behavior During Therapy: Flat affect Overall Cognitive Status: Difficult to assess                      General Comments      Exercises        Assessment/Plan    PT Assessment Patient needs continued PT services  PT Diagnosis Difficulty walking;Generalized  weakness;Altered mental status;Hemiplegia non-dominant side   PT Problem List Decreased strength;Decreased range of motion;Decreased activity tolerance;Decreased balance;Decreased mobility;Decreased coordination;Decreased cognition;Decreased knowledge of use of DME;Decreased safety awareness;Decreased knowledge of precautions  PT Treatment Interventions DME instruction;Gait training;Stair training;Functional mobility training;Therapeutic activities;Therapeutic exercise;Balance  training;Neuromuscular re-education;Cognitive remediation;Patient/family education   PT Goals (Current goals can be found in the Care Plan section) Acute Rehab PT Goals Patient Stated Goal: pt. wants to go home PT Goal Formulation: Patient unable to participate in goal setting Time For Goal Achievement: 10/30/13 Potential to Achieve Goals: Fair    Frequency Min 3X/week   Barriers to discharge Decreased caregiver support unknown level of assist from family at home; if family able to provide increased assistance pt can go home; otherwise recommend SNF    Co-evaluation               End of Session Equipment Utilized During Treatment: Gait belt Activity Tolerance: Patient limited by fatigue;Patient limited by lethargy Patient left: in bed;with call bell/phone within reach Nurse Communication: Mobility status         Time: 1350-1420 PT Time Calculation (min): 30 min   Charges:   PT Evaluation $Initial PT Evaluation Tier I: 1 Procedure PT Treatments $Therapeutic Activity: 23-37 mins   PT G Codes:          Laureen Abrahams 10/16/2013, 2:36 PM  Laureen Abrahams, PT, DPT [phone]

## 2013-10-16 NOTE — Evaluation (Signed)
Clinical/Bedside Swallow Evaluation Patient Details  Name: [name] [name] MRN: [id number] Date of Birth: 04-08-1941  Today's Date: 10/16/2013 Time: 1478-2956 SLP Time Calculation (min): 32 min  Past Medical History:  Past Medical History  Diagnosis Date  . Cancer 2002    colon  . Stroke    Past Surgical History:  Past Surgical History  Procedure Laterality Date  . Colon surgery    . Coronary artery bypass graft     HPI:  73 y.o. male with a PMH of CVA (06/2013 presented with seizures), metastatic colon cancer, CABG, paroxysmal atrial fibrillation, who presents to ED with seizures.  CT revealed no acute intracranial process, large remote right MCA territory infarct.  CXR satisfactory stress set stable support apparatus, Decrease in pulmonary edema.  Intubated 4/30-5/3.    Assessment / Plan / Recommendation Clinical Impression  Pt. exhibited indications of pharyngeal dysphagia which were lessened with modification of liquid consistency to nectar.  Dysphagia likley short term due to intubation and progression to regular texture and liquids is probable in 1-2 days.  Suspect pharyngeal residue with inconsistent multiple swallows.  Pt. required max verbal and tactile cues and clinical reasoning (pt./family) for small, slow sips as pt. demonstrating impulsivity.  MBS unable to be completed today, however recommend pt. initiate conservative diet of Dys 1 and nectar liquids with diagnostic reassessment next date.      Aspiration Risk  Moderate    Diet Recommendation Dysphagia 1 (Puree);Nectar-thick liquid   Liquid Administration via: Cup;No straw Medication Administration: Whole meds with puree Supervision: Patient able to self feed;Full supervision/cueing for compensatory strategies Compensations: Small sips/bites;Slow rate;Check for pocketing Postural Changes and/or Swallow Maneuvers: Seated upright 90 degrees    Other  Recommendations Recommended Consults:  (likely MBS) Oral Care  Recommendations: Oral care BID   Follow Up Recommendations   (TBD)    Frequency and Duration min 2x/week  2 weeks   Pertinent Vitals/Pain WDL         Swallow Study         Oral/Motor/Sensory Function Overall Oral Motor/Sensory Function: Appears within functional limits for tasks assessed   Ice Chips Ice chips: Within functional limits Presentation: Spoon   Thin Liquid Thin Liquid: Impaired Presentation: Cup;Spoon Pharyngeal  Phase Impairments: Cough - Delayed;Suspected delayed Swallow (possible residue)    Nectar Thick Nectar Thick Liquid: Impaired Presentation: Cup Pharyngeal Phase Impairments: Multiple swallows   Honey Thick Honey Thick Liquid: Not tested   Puree Puree: Within functional limits   Solid       Solid: Not tested       Houston Siren M.Ed [employer] [phone]  10/16/2013

## 2013-10-16 NOTE — Progress Notes (Signed)
PULMONARY / CRITICAL CARE MEDICINE   Name: [name] [name] MRN: [id number] DOB: [birth year]    ADMISSION DATE:  10/11/2013 CONSULTATION DATE:  10/12/2013  REFERRING MD :  ED PRIMARY SERVICE: PCCM  CHIEF COMPLAINT:  Seizures  BRIEF PATIENT DESCRIPTION: [name] [name] is a 73 y.o. 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    SIGNIFICANT EVENTS / STUDIES:  4/30 CT head: No acute process; large remote R MCA infarct 4/30 Admit with seizures 4/30 pCXR: Interstitial edema  4/30 lactic up, abx started, line placed, bolus given 5/01 Extubated 5/02 Required re-intubation. Edema pattern on CXR 5/03 CXR much improved. Extubated on NTG gtt. Advanced directives discussion with pt after extubation. Made DNR/I  LINES / TUBES: ETT 4/30 >> 5/01, 5/02 >> 5/03 Left IJ CVL 4/30>>>  CULTURES: MRSA PCR 4/29>>  BC 4/30>> UA 4/30>> UC 4/30>>50 k yeast   ANTIBIOTICS: Anti-infectives   Start     Dose/Rate Route Frequency Ordered Stop   10/14/13 1800  vancomycin (VANCOCIN) IVPB 750 mg/150 ml premix  Status:  Discontinued     750 mg 150 mL/hr over 60 Minutes Intravenous Every 12 hours 10/14/13 0659 10/14/13 1016   10/14/13 1000  levofloxacin (LEVAQUIN) IVPB 750 mg  Status:  Discontinued     750 mg 100 mL/hr over 90 Minutes Intravenous Every 24 hours 10/14/13 0659 10/14/13 1016   10/14/13 0800  vancomycin (VANCOCIN) 1,500 mg in sodium chloride 0.9 % 500 mL IVPB     1,500 mg 250 mL/hr over 120 Minutes Intravenous  Once 10/14/13 0659 10/14/13 1053   10/14/13 0800  ceFEPIme (MAXIPIME) 1 g in dextrose 5 % 50 mL IVPB  Status:  Discontinued     1 g 100 mL/hr over 30 Minutes Intravenous 3 times per day 10/14/13 0659 10/14/13 1016   10/12/13 1430  cefTAZidime (FORTAZ) 1 g in dextrose 5 % 50 mL IVPB  Status:  Discontinued     1 g 100 mL/hr over 30 Minutes Intravenous Every 12 hours 10/12/13 1413 10/14/13  0658      SUBJ:  No new complaints, did well overnight.  VITAL SIGNS: Temp:  [97.7 F (36.5 C)-99.6 F (37.6 C)] 98 F (36.7 C) (05/04 0802) Pulse Rate:  [80-166] 89 (05/04 0800) Resp:  [14-27] 27 (05/04 0600) BP: (99-124)/(49-83) 106/59 mmHg (05/04 0800) SpO2:  [93 %-100 %] 100 % (05/04 0800) FiO2 (%):  [30 %-40 %] 30 % (05/03 1400) Weight:  [158 lb 11.7 oz (72 kg)] 158 lb 11.7 oz (72 kg) (05/04 0500)  HEMODYNAMICS:   VENTILATOR SETTINGS: Vent Mode:  [-] PSV;CPAP FiO2 (%):  [30 %-40 %] 30 % PEEP:  [5 cmH20] 5 cmH20 Pressure Support:  [5 cmH20] 5 cmH20  INTAKE / OUTPUT: Intake/Output     05/03 0701 - 05/04 0700 05/04 0701 - 05/05 0700   I.V. (mL/kg) 1561.2 (21.7) 100 (1.4)   NG/GT     IV Piggyback 740    Total Intake(mL/kg) 2301.2 (32) 100 (1.4)   Urine (mL/kg/hr) 3755 (2.2)    Total Output 3755     Net -1453.8 +100        Urine Occurrence 1 x     PHYSICAL EXAMINATION: General:  NAD Neuro: Diffusely weak HEENT: WNL Cardiovascular: RRR s M Chest/Lungs: No wheezes Abdomen:  +bs, soft, non-distended; urostomy and colostomy present Ext:  Warm, without edema  LABS: I have reviewed all of today's lab results.  Relevant abnormalities are discussed in the A/P section  CBC  Recent Labs Lab 10/14/13 0400 10/15/13 0551 10/16/13 0420  WBC 10.6* 9.7 10.5  HGB 9.9* 9.8* 10.1*  HCT 28.4* 28.1* 29.1*  PLT 255 259 291   Coag's  Recent Labs Lab 10/14/13 0400 10/15/13 0551 10/16/13 0420  INR 2.85* 3.95* 3.65*   BMET  Recent Labs Lab 10/14/13 0828 10/15/13 0551 10/16/13 0420  NA 143 143 144  K 3.8 3.5* 2.7*  CL 109 108 105  CO2 13* 21 28  BUN _0 CREATININE 1.27 1.11 1.06  GLUCOSE 208* 117* 103*   Electrolytes  Recent Labs Lab 10/11/13 2252 10/12/13 0348  10/13/13 0500  10/14/13 0828 10/15/13 0551 10/16/13 0420  CALCIUM 9.3 8.7  < > 7.8*  < > 8.2* 7.7* 8.0*  MG 1.7 2.0  --   --   --   --   --   --   PHOS 3.9  --   --  2.3  --   --    --   --   < > = values in this interval not displayed. Sepsis Markers  Recent Labs Lab 10/12/13 0150 10/12/13 1105 10/12/13 1450 10/13/13 0500 10/14/13 0400 10/14/13 0843  LATICACIDVEN 1.8 3.1*  --   --   --  2.6*  PROCALCITON  --   --  <0.10 0.20 <0.10  --    ABG  Recent Labs Lab 10/13/13 2059 10/14/13 0550 10/14/13 0820  PHART 7.458* 7.266* 7.289*  PCO2ART 23.2* 21.0* 29.8*  PO2ART 67.1* 83.8 216.0*   Liver Enzymes  Recent Labs Lab 10/11/13 2252 10/13/13 0500 10/15/13 0551  AST _1 ALT _2 ALKPHOS 52 35* 37*  BILITOT 0.4 <0.2* 0.2*  ALBUMIN 3.7 2.6* 2.6*   Cardiac Enzymes  Recent Labs Lab 10/12/13 0348  10/14/13 0828 10/14/13 1245 10/14/13 1845 10/15/13 0552  TROPONINI 0.43*  < > 1.07* 1.60* 1.34* 0.84*  PROBNP 2369.0*  --  28561.0*  --   --  24975.0*  < > = values in this interval not displayed. Glucose  Recent Labs Lab 10/15/13 0327 10/15/13 0813 10/15/13 1215 10/15/13 1603 10/15/13 2005 10/16/13 0800  GLUCAP 102* 92 117* 116* 105* 112*    Imaging Dg Chest Port 1 View  10/16/2013   CLINICAL DATA:  Respiratory failure.  EXAM: PORTABLE CHEST - 1 VIEW  COMPARISON:  DG CHEST 1V PORT dated 10/15/2013; DG CHEST 1V PORT dated 10/14/2013  FINDINGS: Interim extubation removal NG tube. Left IJ line in stable position. Stable cardiomegaly are pulmonary edema. No evidence progression. No pleural effusion. No pneumothorax.  IMPRESSION: 1. Interim extubation removal NG tube. Stable positioning of left IJ tube. 2. Stable mild pulmonary edema.   Electronically Signed   By: Marcello Moores  Register   On: 10/16/2013 07:17   Dg Chest Port 1 View  10/15/2013   CLINICAL DATA:  Respiratory failure  EXAM: PORTABLE CHEST - 1 VIEW  COMPARISON:  10/14/2013  FINDINGS: The ET tube tip is above the carina. There is a left IJ catheter with tip in the projection of the SVC. Nasogastric tube tip is in the stomach. The heart size and mediastinal contours are within normal limits.  There has been interval improvement in pulmonary edema. The visualized skeletal structures are unremarkable.  IMPRESSION: 1. Satisfactory stress set stable support apparatus. 2. Decrease in pulmonary edema.   Electronically Signed   By: Kerby Moors M.D.   On: 10/15/2013 08:00  Dg Abd Portable 1v  10/14/2013   CLINICAL DATA:  NG tube placement.  EXAM: PORTABLE ABDOMEN - 1 VIEW  COMPARISON:  10/12/2013  FINDINGS: Enteric tube is present with side hole overlying the gastric body and tip in the region of the distal stomach. Gas is present in nondilated loops of bowel. Calcification /residual oral contrast material in the right mid abdomen is unchanged. Surgical clips are present in the upper abdomen and pelvis. No acute osseous abnormality is identified. Left lower quadrant ostomy is noted.  IMPRESSION: Enteric tube as above with tip in the region of the distal stomach.   Electronically Signed   By: Logan Bores   On: 10/14/2013 12:25   ASSESSMENT / PLAN:  PULMONARY A:  Acute resp failure Pulmonary edema uncompensated met acidosis - improved on HCO3 gtt Failed extubation 5/01 Do not re-intubate P: - Supplemental O2 as needed. - NTG as needed. - Swallow evaluation as ordered.  CARDIOVASCULAR A:  - hx of CAD, s/p CABG - hx of PAF. On Coumadin, but INR subtherapeutic -Elevated trop I - NSTEMI Severe dilated cardiomyopathy P:  - Not candidate for invasive eval presently. - D/C NTG drip and start nitropaste. - Consider Cards eval (?HF team) if continues to do well by AM.  RENAL A:   - hx of bladder cancer-->s/p of urostomy - hx of CKD-III with BL cre 1.33 to 2.34. creatinine slightly elevated from 1.33 on 3/24-->1.66, but seems be to in his baseline range - NON AG acidosis P:   - Monitor BMET intermittently. - Monitor I/Os. - Correct electrolytes as indicated. - Transition to oral bicarb 5/04.  GASTROINTESTINAL A:   - hx of GERD - hx of colon cancer-->s/p of colostomy, no signs  of infection around the colostomy site.  - Colon cancer with resection (2001), metastatic colon cancer (2002) involving bladder with cystectomy and urostomy tubes and colostomy. H/o cdiff  P:   - IV Pepcid - Swallow evaluation and diet as ordered, full DNR at this point. - Limit abx with h/o cdiff when able, pending neuro condition.  HEMATOLOGIC A:   Mild anemia P:  - DVT px: SQ heparin. - Monitor CBC intermittently. - Transfuse per usual ICU guidelines.  INFECTIOUS A:   No overt infection identified Normal PCT P:   - Micro and abx as above.  NEUROLOGIC A:  Seizure D/O H/O CVA P: - Cont AEDs  Full DNR, transfer to med-surg and to [facility], PCCM will sign off, please call back if needed.  Rush Farmer, M.D. [facility] Pulmonary/Critical Care Medicine. Pager: [phone]. After hours pager: [phone].

## 2013-10-16 NOTE — Progress Notes (Signed)
NUTRITION CONSULT/ FOLLOW UP  Intervention:   Ensure Complete po BID, each supplement providing 350 kcals and 13 grams of protein  RD to continue to follow nutrition care plan  Nutrition Dx:   Inadequate oral intake related to inability to eat as evidenced by NPO status; resolved.   Goal:   Meet >/=90% of estimated nutrition needs; not met  Monitor:   PO intake, weight trend, labs  Assessment:   Patient is a 73 y.o. 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   4/30 - Pt initiated TF regimen of Vital AF 1.2 to goal of 19m/hr  5/1- Stopped TF  5/4- Pt transferred from ICU and advanced to Dys 1 diet with nectar thickened liquids per SLP  Pt has not received any meals so far since diet advanced. Pt reports that he is hungry and could eat something.   Pt reports drinking Ensure at home and requests one during assessment. Dietetic intern asked RN to provide patient with Ensure. RN reports that she will order some thickener and provide the patient with one.   Chart shows some weight loss since admission. Pt was 162 lbs on 4/30 and is now 158 lbs. Suspect some of this is due to fluid while in-patient. Encouraged patient to consume oral supplement to prevent further weight loss.     Height: Ht Readings from Last 1 Encounters:  10/12/13 6' 2" (1.88 m)    Weight Status:   Wt Readings from Last 1 Encounters:  10/16/13 158 lb 11.7 oz (72 kg)  Admission weight 162 lb (73.5 kg) 4/30  Re-estimated needs:  Kcal: 1800 - 2000 Protein: 90 -105 g Fluid: 1.8 - 2 L/day  Skin: Left ankle laceration   Diet Order: Dysphagia 1, Nectar thickened liquids    Intake/Output Summary (Last 24 hours) at 10/16/13 1440 Last data filed at 10/16/13 1200  Gross per 24 hour  Intake 1867.13 ml  Output   3530 ml  Net -1662.87 ml    Last BM: 5/3    Labs:   Recent Labs Lab 10/11/13 2252   10/12/13 0348  10/13/13 0500  10/14/13 0828 10/15/13 0551 10/16/13 0420  NA 140  --  138  < > 142  < > 143 143 144  K 4.2  < > 5.1  < > 3.5*  < > 3.8 3.5* 2.7*  CL 106  --  106  < > 113*  < > 109 108 105  CO2 19  --  14*  < > 16*  < > 13* 21 28  BUN 20  --  20  < > 16  < > _0 CREATININE 1.60*  --  1.40*  < > 1.32  < > 1.27 1.11 1.06  CALCIUM 9.3  --  8.7  < > 7.8*  < > 8.2* 7.7* 8.0*  MG 1.7  --  2.0  --   --   --   --   --   --   PHOS 3.9  --   --   --  2.3  --   --   --   --   GLUCOSE 127*  --  143*  < > 114*  < > 208* 117* 103*  < > = values in this interval not displayed.  CBG (last 3)   Recent Labs  10/15/13 1603 10/15/13 2005 10/16/13 0800  GLUCAP 116* 105* 112*  Scheduled Meds: . antiseptic oral rinse  15 mL Mouth Rinse QID  . aspirin  325 mg Oral Daily  . chlorhexidine  15 mL Mouth Rinse BID  . Chlorhexidine Gluconate Cloth  6 each Topical Q0600  . diltiazem  60 mg Oral Q12H  . levETIRAcetam  1,000 mg Intravenous Q12H  . mupirocin ointment  1 application Nasal BID  . nitroGLYCERIN  0.1 mg Transdermal Daily  . pantoprazole (PROTONIX) IV  40 mg Intravenous Q24H  . potassium chloride  40 mEq Oral TID  . simvastatin  20 mg Oral q1800  . sodium bicarbonate  650 mg Oral TID  . Warfarin - Pharmacist Dosing Inpatient   Does not apply q1800    Continuous Infusions: . sodium chloride 10 mL/hr at 10/13/13 0502  .  sodium bicarbonate  infusion 1000 mL 50 mL/hr at 10/15/13 [place], BS Dietetic Intern Pager: [phone]  Intern note/chart reviewed. Revisions made.  [place], [place], [place] Pager [phone] After Hours Pager

## 2013-10-17 LAB — CBC
HCT: 30.3 % — ABNORMAL LOW (ref 39.0–52.0)
Hemoglobin: 10.2 g/dL — ABNORMAL LOW (ref 13.0–17.0)
MCH: 29.4 pg (ref 26.0–34.0)
MCHC: 33.7 g/dL (ref 30.0–36.0)
MCV: 87.3 fL (ref 78.0–100.0)
Platelets: 249 10*3/uL (ref 150–400)
RBC: 3.47 MIL/uL — ABNORMAL LOW (ref 4.22–5.81)
RDW: 17.7 % — ABNORMAL HIGH (ref 11.5–15.5)
WBC: 10.9 10*3/uL — ABNORMAL HIGH (ref 4.0–10.5)

## 2013-10-17 LAB — BASIC METABOLIC PANEL
BUN: 9 mg/dL (ref 6–23)
CALCIUM: 7.8 mg/dL — AB (ref 8.4–10.5)
CO2: 28 mEq/L (ref 19–32)
Chloride: 106 mEq/L (ref 96–112)
Creatinine, Ser: 1.02 mg/dL (ref 0.50–1.35)
GFR calc Af Amer: 83 mL/min — ABNORMAL LOW (ref >90)
GFR calc non Af Amer: 72 mL/min — ABNORMAL LOW (ref >90)
GLUCOSE: 108 mg/dL — AB (ref 70–99)
POTASSIUM: 3.9 meq/L (ref 3.7–5.3)
Sodium: 146 mEq/L (ref 137–147)

## 2013-10-17 LAB — PROTIME-INR
INR: 2.43 — ABNORMAL HIGH (ref 0.00–1.49)
Prothrombin Time: 25.6 seconds — ABNORMAL HIGH (ref 11.6–15.2)

## 2013-10-17 LAB — PHOSPHORUS: PHOSPHORUS: 1.8 mg/dL — AB (ref 2.3–4.6)

## 2013-10-17 LAB — MAGNESIUM: Magnesium: 1.6 mg/dL (ref 1.5–2.5)

## 2013-10-17 MED ORDER — WARFARIN SODIUM 4 MG PO TABS
4.0000 mg | ORAL_TABLET | Freq: Once | ORAL | Status: AC
  Administered 2013-10-17: 4 mg via ORAL
  Filled 2013-10-17: qty 1

## 2013-10-17 MED ORDER — KETOROLAC TROMETHAMINE 30 MG/ML IJ SOLN
30.0000 mg | Freq: Once | INTRAMUSCULAR | Status: AC
  Administered 2013-10-17: 30 mg via INTRAVENOUS
  Filled 2013-10-17: qty 1

## 2013-10-17 MED ORDER — PANTOPRAZOLE SODIUM 40 MG PO TBEC
40.0000 mg | DELAYED_RELEASE_TABLET | Freq: Every day | ORAL | Status: DC
  Administered 2013-10-18: 40 mg via ORAL
  Filled 2013-10-17: qty 1

## 2013-10-17 MED ORDER — PRAVASTATIN SODIUM 40 MG PO TABS
40.0000 mg | ORAL_TABLET | Freq: Every day | ORAL | Status: DC
  Administered 2013-10-17 – 2013-10-19 (×3): 40 mg via ORAL
  Filled 2013-10-17 (×3): qty 1

## 2013-10-17 MED ORDER — FUROSEMIDE 40 MG PO TABS
40.0000 mg | ORAL_TABLET | Freq: Every day | ORAL | Status: DC
  Administered 2013-10-17 – 2013-10-19 (×3): 40 mg via ORAL
  Filled 2013-10-17 (×3): qty 1

## 2013-10-17 MED ORDER — LEVETIRACETAM 500 MG PO TABS
1000.0000 mg | ORAL_TABLET | Freq: Two times a day (BID) | ORAL | Status: DC
  Administered 2013-10-17 – 2013-10-19 (×5): 1000 mg via ORAL
  Filled 2013-10-17 (×6): qty 2

## 2013-10-17 NOTE — Progress Notes (Signed)
Received new order for Toradol from MD for pain issue.  MD will make next shift aware of PRN pain medication not effective.

## 2013-10-17 NOTE — Progress Notes (Signed)
[facility] PROGRESS NOTE  [name] [name] BEE:[id number] DOB: 1940/09/03 DOA: 10/11/2013 PCP: Redge Gainer, MD  Assessment/Plan: 1. Seizures 1. Neurology following 2. Controlled on keppra and dilantin 3. EEG unremarkable 4. Neuro to f/u as outpt 2. Acute respiratory failure 1. Required intubation - failed extubation 5/1 2. Note to DO NOT RE-INTUBATE 3. Last cxr with stable pulm edema (5/4) 4. Cont with lasix as tolerated - will repeat cxr in AM 3. Dilated cardiomyopathy 1. EF 15% with severe hypokinesis 2. Per above, will cont on lasix and repeat cxr in AM 3. Strict i/o and daily wt 4. DVT prophylaxis 1. Heparin subq  Code Status: DNR/DNI Family Communication: Pt in room (indicate person spoken with, relationship, and if by phone, the number) Disposition Plan: Pending   Consultants:  Critical Care  Neurology  Procedures: 4/30 CT head: No acute process; large remote R MCA infarct  4/30 Admit with seizures  4/30 pCXR: Interstitial edema  4/30 lactic up, abx started, line placed, bolus given  5/01 Extubated  5/02 Required re-intubation. Edema pattern on CXR  5/03 CXR much improved. Extubated on NTG gtt. Advanced directives discussion with pt after extubation. Made DNR/I  Antibiotics:    HPI/Subjective: No complaints. Denies sob or cp  Objective: Filed Vitals:   10/16/13 1200 10/16/13 1300 10/16/13 2235 10/17/13 0616  BP: 117/67 102/56 118/58 109/79  Pulse: 92 67 91 83  Temp:  97.6 F (36.4 C) 98 F (36.7 C) 98.1 F (36.7 C)  TempSrc:  Oral Oral Oral  Resp: 17 18 18 18   Height:      Weight:    72.213 kg (159 lb 3.2 oz)  SpO2: 99% 100% 100% 97%    Intake/Output Summary (Last 24 hours) at 10/17/13 1439 Last data filed at 10/17/13 1400  Gross per 24 hour  Intake   2030 ml  Output   1325 ml  Net    705 ml   Filed Weights   10/15/13 0500 10/16/13 0500 10/17/13 0616  Weight: 77 kg (169 lb 12.1 oz) 72 kg (158 lb 11.7 oz) 72.213 kg (159 lb 3.2 oz)     Exam:   General:  Awake, in nad  Cardiovascular: regular, s1, s2  Respiratory: normal resp effort, no wheezing  Abdomen: soft, nondistended  Musculoskeletal: perfused, no clubbing   Data Reviewed: Basic Metabolic Panel:  Recent Labs Lab 10/11/13 2252  10/12/13 0348  10/13/13 0500  10/14/13 0400 10/14/13 0828 10/15/13 0551 10/16/13 0420 10/17/13 0446  NA 140  --  138  < > 142  < > 142 143 143 144 146  K 4.2  < > 5.1  < > 3.5*  < > 3.3* 3.8 3.5* 2.7* 3.9  CL 106  --  106  < > 113*  < > 111 109 108 105 106  CO2 19  --  14*  < > 16*  < > 16* 13* 21 28 28   GLUCOSE 127*  --  143*  < > 114*  < > 92 208* 117* 103* 108*  BUN 20  --  20  < > 16  < > 12 14 11 9 9   CREATININE 1.60*  --  1.40*  < > 1.32  < > 1.14 1.27 1.11 1.06 1.02  CALCIUM 9.3  --  8.7  < > 7.8*  < > 8.2* 8.2* 7.7* 8.0* 7.8*  MG 1.7  --  2.0  --   --   --   --   --   --   --  1.6  PHOS 3.9  --   --   --  2.3  --   --   --   --   --  1.8*  < > = values in this interval not displayed. Liver Function Tests:  Recent Labs Lab 10/11/13 2252 10/13/13 0500 10/15/13 0551  AST 17 24 19   ALT 8 7 11   ALKPHOS 52 35* 37*  BILITOT 0.4 <0.2* 0.2*  PROT 8.1 5.8* 5.8*  ALBUMIN 3.7 2.6* 2.6*   No results found for this basename: LIPASE, AMYLASE,  in the last 168 hours No results found for this basename: AMMONIA,  in the last 168 hours CBC:  Recent Labs Lab 10/11/13 2252 10/13/13 0500 10/14/13 0400 10/15/13 0551 10/16/13 0420 10/17/13 0446  WBC 11.7* 9.0 10.6* 9.7 10.5 10.9*  NEUTROABS  --  5.6 7.5  --   --   --   HGB 12.0* 9.3* 9.9* 9.8* 10.1* 10.2*  HCT 35.5* 27.0* 28.4* 28.1* 29.1* 30.3*  MCV 86.6 85.7 84.8 84.9 85.6 87.3  PLT 321 227 255 259 291 249   Cardiac Enzymes:  Recent Labs Lab 10/12/13 0348  10/14/13 0145 10/14/13 0828 10/14/13 1245 10/14/13 1845 10/15/13 0552  CKTOTAL 139  --   --   --   --   --   --   TROPONINI 0.43*  < > 0.85* 1.07* 1.60* 1.34* 0.84*  < > = values in this  interval not displayed. BNP (last 3 results)  Recent Labs  10/12/13 0348 10/14/13 0828 10/15/13 0552  PROBNP 2369.0* 28561.0* 24975.0*   CBG:  Recent Labs Lab 10/15/13 0813 10/15/13 1215 10/15/13 1603 10/15/13 2005 10/16/13 0800  GLUCAP 92 117* 116* 105* 112*    Recent Results (from the past 240 hour(s))  MRSA PCR SCREENING     Status: Abnormal   Collection Time    10/12/13  2:33 AM      Result Value Ref Range Status   MRSA by PCR POSITIVE (*) NEGATIVE Final   Comment:            The GeneXpert MRSA Assay (FDA     approved for NASAL specimens     only), is one component of a     comprehensive MRSA colonization     surveillance program. It is not     intended to diagnose MRSA     infection nor to guide or     monitor treatment for     MRSA infections.     RESULT CALLED TO, READ BACK BY AND VERIFIED WITH:     Nelta Numbers RN [id number] GREEN R  URINE CULTURE     Status: None   Collection Time    10/12/13  2:54 AM      Result Value Ref Range Status   Specimen Description URINE, RANDOM   Final   Special Requests NONE   Final   Culture  Setup Time     Final   Value: 10/12/2013 10:01     Performed at [place]     Final   Value: 50,000 COLONIES/ML     Performed at [facility]   Culture     Final   Value: YEAST     Performed at [facility]   Report Status 10/13/2013 FINAL   Final  CULTURE, BLOOD (ROUTINE X 2)     Status: None   Collection Time    10/12/13  3:48 AM  Result Value Ref Range Status   Specimen Description BLOOD RIGHT HAND   Final   Special Requests BOTTLES DRAWN AEROBIC ONLY 1.5CC   Final   Culture  Setup Time     Final   Value: 10/12/2013 08:30     Performed at [facility]   Culture     Final   Value:        BLOOD CULTURE RECEIVED NO GROWTH TO DATE CULTURE WILL BE HELD FOR 5 DAYS BEFORE ISSUING A FINAL NEGATIVE REPORT     Performed at [facility]   Report Status PENDING    Incomplete  CULTURE, BLOOD (ROUTINE X 2)     Status: None   Collection Time    10/12/13  5:42 AM      Result Value Ref Range Status   Specimen Description BLOOD LEFT HAND   Final   Special Requests BOTTLES DRAWN AEROBIC ONLY 4CC   Final   Culture  Setup Time     Final   Value: 10/12/2013 08:31     Performed at [facility]   Culture     Final   Value:        BLOOD CULTURE RECEIVED NO GROWTH TO DATE CULTURE WILL BE HELD FOR 5 DAYS BEFORE ISSUING A FINAL NEGATIVE REPORT     Performed at [facility]   Report Status PENDING   Incomplete  CULTURE, RESPIRATORY (NON-EXPECTORATED)     Status: None   Collection Time    10/12/13  3:00 PM      Result Value Ref Range Status   Specimen Description ENDOTRACHEAL   Final   Special Requests Normal   Final   Gram Stain     Final   Value: ABUNDANT WBC PRESENT,BOTH PMN AND MONONUCLEAR     RARE SQUAMOUS EPITHELIAL CELLS PRESENT     MODERATE GRAM POSITIVE COCCI     IN PAIRS IN CLUSTERS     Performed at [facility]   Culture     Final   Value: Non-Pathogenic Oropharyngeal-type Flora Isolated.     Performed at [facility]   Report Status 10/14/2013 FINAL   Final  CULTURE, BLOOD (ROUTINE X 2)     Status: None   Collection Time    10/14/13  7:30 AM      Result Value Ref Range Status   Specimen Description BLOOD LEFT ARM   Final   Special Requests BOTTLES DRAWN AEROBIC ONLY 2CC   Final   Culture  Setup Time     Final   Value: 10/14/2013 14:10     Performed at [facility]   Culture     Final   Value:        BLOOD CULTURE RECEIVED NO GROWTH TO DATE CULTURE WILL BE HELD FOR 5 DAYS BEFORE ISSUING A FINAL NEGATIVE REPORT     Performed at [facility]   Report Status PENDING   Incomplete  CULTURE, BLOOD (ROUTINE X 2)     Status: None   Collection Time    10/14/13  7:35 AM      Result Value Ref Range Status   Specimen Description BLOOD LEFT HAND   Final   Special Requests BOTTLES DRAWN AEROBIC ONLY  3CC   Final   Culture  Setup Time     Final   Value: 10/14/2013 14:10     Performed at [place]     Final  Value:        BLOOD CULTURE RECEIVED NO GROWTH TO DATE CULTURE WILL BE HELD FOR 5 DAYS BEFORE ISSUING A FINAL NEGATIVE REPORT     Performed at [facility]   Report Status PENDING   Incomplete     Studies: Dg Chest Port 1 View  10/16/2013   CLINICAL DATA:  Respiratory failure.  EXAM: PORTABLE CHEST - 1 VIEW  COMPARISON:  DG CHEST 1V PORT dated 10/15/2013; DG CHEST 1V PORT dated 10/14/2013  FINDINGS: Interim extubation removal NG tube. Left IJ line in stable position. Stable cardiomegaly are pulmonary edema. No evidence progression. No pleural effusion. No pneumothorax.  IMPRESSION: 1. Interim extubation removal NG tube. Stable positioning of left IJ tube. 2. Stable mild pulmonary edema.   Electronically Signed   By: Garey   On: 10/16/2013 07:17    Scheduled Meds: . antiseptic oral rinse  15 mL Mouth Rinse QID  . aspirin  325 mg Oral Daily  . chlorhexidine  15 mL Mouth Rinse BID  . Chlorhexidine Gluconate Cloth  6 each Topical Q0600  . diltiazem  60 mg Oral Q12H  . feeding supplement (ENSURE COMPLETE)  237 mL Oral BID BM  . levETIRAcetam  1,000 mg Oral BID  . mupirocin ointment  1 application Nasal BID  . nitroGLYCERIN  0.1 mg Transdermal Daily  . OxyCODONE  20 mg Oral Q12H  . [START ON 10/18/2013] pantoprazole  40 mg Oral QAC breakfast  . pravastatin  40 mg Oral q1800  . sodium bicarbonate  650 mg Oral TID  . warfarin  4 mg Oral ONCE-1800  . Warfarin - Pharmacist Dosing Inpatient   Does not apply q1800   Continuous Infusions: . sodium chloride 10 mL/hr at 10/13/13 0502  .  sodium bicarbonate  infusion 1000 mL 50 mL/hr at 10/17/13 1143    Active Problems:   Seizure   Acute respiratory failure   Hypotension, unspecified   Altered mental status   Malnutrition of moderate degree   Dilated cardiomyopathy   Pulmonary edema   Metabolic  acidosis, normal anion gap (NAG)  Time spent: 63min  Stephen K Chiu  [facility] Pager [phone]. If 7PM-7AM, please contact night-coverage at [email or web address], password [facility] 10/17/2013, 2:39 PM  LOS: 6 days

## 2013-10-17 NOTE — Clinical Documentation Improvement (Signed)
   Clinical Information:   - Pulmonary Edema noted on CXR following 2nd intubation   - Echo this admission    EF 15%, Elevated CVP   - Treated with IV Lasix  Possible Clinical Conditions:   - Acute on Chronic Systolic Heart Failure   - Other Condition   - Unable to Clinically Determine     Thank You,  Erling Conte ,RN BSN CCDS Certified Clinical Documentation Specialist:  [phone] [place] Information Management

## 2013-10-17 NOTE — Consult Note (Signed)
WOC ostomy follow up Stoma type/location:  Colostomy: LLQ Urostomy: RLA Stomal assessment/size: see last week's note, these are established ostomies present for several years.  Peristomal assessment: intact at both sites Treatment options for stomal/peristomal skin:  With flush colostomy stoma, used karaya pouch for some convexity around stoma Used 2" barrier ring around the urostomy due the lack of stoma, appears may be retracted stoma or simple ureterostomy Output  Colostomy: liquid green/brown Urostomy: yellow urine with minimal mucus Ostomy pouching: 1pc./2pc.  2" karaya used for the colostomy, as this is what had been placed in the room and patient was leaking when I arrived.  He has been using 2pc at home but unclear if with a barrier ring or convex as his son cares for stoma at home and is not in the room 1pc urostomy used with barrier ring to create convexity due to lack of stoma Pt is cared for by son and they have systems that work for him a home. Will use products available here in the hospital until pt dc to home  [place] nurse will follow along with you for ostomy care and [place] RN,CWOCN [phone]

## 2013-10-17 NOTE — Care Management Note (Signed)
  Page 2 of 2   10/19/2013     3:32:07 PM CARE MANAGEMENT NOTE 10/19/2013  Patient:  [name] [name]   Account Number:  [id number]  Date Initiated:  10/17/2013  Documentation initiated by:  Magdalen Spatz  Subjective/Objective Assessment:     Action/Plan:   Anticipated DC Date:  10/19/2013   Anticipated DC Plan:  HOME W HOSPICE CARE         Choice offered to / List presented to:  C-1 Patient           [place]   Status of service:   Medicare Important Message given?   (If response is "NO", the following Medicare IM given date fields will be blank) Date Medicare IM given:   Date Additional Medicare IM given:  10/18/2013  Discharge Disposition:    Per UR Regulation:    If discussed at Long Length of Stay Meetings, dates discussed:   10/19/2013    Comments:  10-19-13 [facility] notified of discharge and discharge summary faxed to them .  Magdalen Spatz RN BSN   10-19-13 Spoke to patient and son Yader via phone . Both in agreement that patient will discharge to home with [facility] . Discussed DME needs with both , they would like cane , hospital bed , over the bed table , they do not want  3 in 1, wheelchair, or walker .  Son will transport patient home via car when patient is discharged.  Beth at [facility] aware and information faxed to her . She will need discharge summary when completed. Beth will order DME .  Magdalen Spatz RN BSN [phone]    10-19-13 Received phone call from [place] at [facility] ( [place] , fax [phone] ) . Per Eustaquio Maize patient's son Marolyn Hammock has called [facility] , requesting their services for patient . Son also requesting hospital bed .  Spoke with patient , he was unware of this and has not decided on home health vs hospice . But agreed to hospital bed and a cane ( does not want a walker ) . Encouraged patinet to speak with his son . Dialed [facility]'s  phone number for patient , Chrisotpher answered . Will give the two time to discuss options and check back with patient .  Left list of Home Health agencies ( patient  had [facility] to [facility] ) , and list of home hospice agencies with patient.  Magdalen Spatz RN BSN [phone]    10-17-13 Per social worker patient declining SNF , MD aware awaiting any home health orders . Magdalen Spatz RN BSN

## 2013-10-17 NOTE — Progress Notes (Signed)
Speech Language Pathology Treatment: Dysphagia  Patient Details Name: [name] [name] MRN: [id number] DOB: 08/27/1940 Today's Date: 10/17/2013 Time: 6812-7517 SLP Time Calculation (min): 10 min  Assessment / Plan / Recommendation Clinical Impression  Pt. Seen for dysphagia treatment.  Increased alertness and awareness.  No indications of oral or pharyngeal dysphagia during observation of regular and thin liquids; no pocketing and timely oral prep and transit of solid texture.  SLP recommends upgrade to regular texture solids and thin liquid, straws ok and pills with thin.  No follow up needed.   HPI HPI: 73 y.o. male with a PMH of CVA (06/2013 presented with seizures), metastatic colon cancer, CABG, paroxysmal atrial fibrillation, who presents to ED with seizures.  CT revealed no acute intracranial process, large remote right MCA territory infarct.  CXR satisfactory stress set stable support apparatus, Decrease in pulmonary edema.  Intubated 4/30-5/3.    Pertinent Vitals WDL  SLP Plan  Continue with current plan of care    Recommendations Diet recommendations: Regular;Thin liquid Liquids provided via: Cup;Straw Medication Administration: Whole meds with liquid Supervision: Patient able to self feed;Intermittent supervision to cue for compensatory strategies Compensations: Small sips/bites;Slow rate;Check for pocketing Postural Changes and/or Swallow Maneuvers: Seated upright 90 degrees              Oral Care Recommendations: Oral care BID Follow up Recommendations: None Plan: Continue with current plan of care    GO     Houston Siren M.Ed [employer] [phone]  10/17/2013

## 2013-10-17 NOTE — Progress Notes (Signed)
Md on call notified that pt has complaint of pain and no pain management ordered. Arthor Captain LPN

## 2013-10-17 NOTE — Progress Notes (Signed)
Pt called to desk asking for pain medication, PRN medication given within last hour, explained to pt schedule of PRN pain medication.  Pt  Voiced understanding

## 2013-10-17 NOTE — Progress Notes (Signed)
ANTICOAGULATION CONSULT NOTE - Follow Up Consult  Pharmacy Consult for Coumadin Indication: atrial fibrillation  No Known Allergies  Patient Measurements: Height: 6\' 2"  (188 cm) Weight: 159 lb 3.2 oz (72.213 kg) IBW/kg (Calculated) : 82.2  Vital Signs: Temp: 98.1 F (36.7 C) (05/05 0616) Temp src: Oral (05/05 0616) BP: 109/79 mmHg (05/05 0616) Pulse Rate: 83 (05/05 0616)  Labs:  Recent Labs  10/14/13 1245 10/14/13 1845  10/15/13 0551 10/15/13 0552 10/16/13 0420 10/17/13 0446  HGB  --   --   < > 9.8*  --  10.1* 10.2*  HCT  --   --   --  28.1*  --  29.1* 30.3*  PLT  --   --   --  259  --  291 249  LABPROT  --   --   --  37.1*  --  34.9* 25.6*  INR  --   --   --  3.95*  --  3.65* 2.43*  CREATININE  --   --   --  1.11  --  1.06 1.02  TROPONINI 1.60* 1.34*  --   --  0.84*  --   --   < > = values in this interval not displayed.  Estimated Creatinine Clearance: 67.8 ml/min (by C-G formula based on Cr of 1.02).    Assessment: 73 yo M on Coumadin PTA for PAF. No coumadin past 2 days d/t supratherapeutic INR.  INR 2.43  this morning.  CBC is stable.  No bleeding or complications noted. On dys 1 diet.  PTA = 2mg  qday except 4mg  MWF  Goal of Therapy:  INR 2-3 Monitor platelets by anticoagulation protocol: Yes   Plan:  1.  Coumadin 4 mg po x 1 dose today. 2. Continue daily PT/INR. 3. Change IV keppra to PO, same dose, 1000 bid 4. Change IV PPI to PO 5. Change simva back to home pravachol since pt on dilitazem 6. Consider resuming home meds: sodium bicarb, cymbalta, lisinopril. Coreg, iron, MVI and changing diltiazem to long acting  Eudelia Bunch, Pharm.D. [phone] 10/17/2013 9:43 AM

## 2013-10-17 NOTE — Clinical Social Work Psychosocial (Signed)
Clinical Social Work Department BRIEF PSYCHOSOCIAL ASSESSMENT 10/17/2013  Patient:  [name] [name], [name] [name]     Account Number:  [id number]     Admit date:  10/11/2013  Clinical Social Worker:  Donna Christen  Date/Time:  10/17/2013 02:33 PM  Referred by:  Physician  Date Referred:  10/17/2013 Referred for  SNF Placement   Other Referral:   none.   Interview type:  Patient Other interview type:   none.    PSYCHOSOCIAL DATA Living Status:  FAMILY Admitted from facility:   Level of care:   Primary support name:  [name] [name] Primary support relationship to patient:  CHILD, ADULT Degree of support available:   Strong support system. Pt reports pt lives with pt's son [[name]] and three grandchildren.    CURRENT CONCERNS Current Concerns  Post-Acute Placement   Other Concerns:   none.    SOCIAL WORK ASSESSMENT / PLAN CSW received consult for SNF placement. CSW met with pt at bedside to discuss discharge disposition. Per pt, pt refusing SNF placement but agreeable to home health services. Pt reported he has been using a four-prong cane for "over 10 years and get around just fine."    Pt reports have a strong support system from pt's children and grandchildren. CSW informed RNCM of pt's dishcarge disposition preference. RNCM to meet with pt and notify MD.    [place] signing off.   Assessment/plan status:  Psychosocial Support/Ongoing Assessment of Needs Other assessment/ plan:   none.   Information/referral to community resources:   none, pt refusing SNF placement.    PATIENTS/FAMILYS RESPONSE TO PLAN OF CARE: Pt is not agreeable to CSW plan of care. Pt stated he was agreeable to home health services at time of discharge. RNCM notified.       [name] [name], [place] Social Worker [phone]

## 2013-10-18 ENCOUNTER — Inpatient Hospital Stay (HOSPITAL_COMMUNITY): Payer: Medicare Other

## 2013-10-18 ENCOUNTER — Encounter (HOSPITAL_COMMUNITY): Payer: Self-pay | Admitting: Internal Medicine

## 2013-10-18 ENCOUNTER — Telehealth: Payer: Self-pay | Admitting: Family Medicine

## 2013-10-18 DIAGNOSIS — I5021 Acute systolic (congestive) heart failure: Secondary | ICD-10-CM

## 2013-10-18 DIAGNOSIS — I4891 Unspecified atrial fibrillation: Secondary | ICD-10-CM

## 2013-10-18 DIAGNOSIS — I10 Essential (primary) hypertension: Secondary | ICD-10-CM

## 2013-10-18 LAB — CULTURE, BLOOD (ROUTINE X 2)
CULTURE: NO GROWTH
CULTURE: NO GROWTH

## 2013-10-18 LAB — PROTIME-INR
INR: 2.37 — AB (ref 0.00–1.49)
PROTHROMBIN TIME: 25.1 s — AB (ref 11.6–15.2)

## 2013-10-18 MED ORDER — DIGOXIN 0.25 MG/ML IJ SOLN
0.2500 mg | Freq: Once | INTRAMUSCULAR | Status: AC
  Administered 2013-10-18: 0.25 mg via INTRAVENOUS
  Filled 2013-10-18: qty 1

## 2013-10-18 MED ORDER — CARVEDILOL 12.5 MG PO TABS
12.5000 mg | ORAL_TABLET | Freq: Two times a day (BID) | ORAL | Status: DC
  Administered 2013-10-18 – 2013-10-19 (×3): 12.5 mg via ORAL
  Filled 2013-10-18 (×4): qty 1

## 2013-10-18 MED ORDER — DIGOXIN 250 MCG PO TABS
0.2500 mg | ORAL_TABLET | Freq: Every day | ORAL | Status: DC
  Administered 2013-10-19: 0.25 mg via ORAL
  Filled 2013-10-18: qty 1

## 2013-10-18 MED ORDER — WARFARIN SODIUM 4 MG PO TABS
4.0000 mg | ORAL_TABLET | Freq: Once | ORAL | Status: AC
  Administered 2013-10-18: 4 mg via ORAL
  Filled 2013-10-18: qty 1

## 2013-10-18 MED ORDER — PHENYTOIN 50 MG PO CHEW
100.0000 mg | CHEWABLE_TABLET | Freq: Three times a day (TID) | ORAL | Status: DC
  Administered 2013-10-18 – 2013-10-19 (×4): 100 mg via ORAL
  Filled 2013-10-18 (×5): qty 2

## 2013-10-18 MED ORDER — DIGOXIN 0.25 MG/ML IJ SOLN
0.1250 mg | Freq: Four times a day (QID) | INTRAMUSCULAR | Status: AC
  Administered 2013-10-18 – 2013-10-19 (×2): 0.125 mg via INTRAVENOUS
  Filled 2013-10-18 (×4): qty 0.5

## 2013-10-18 MED ORDER — DULOXETINE HCL 60 MG PO CPEP
60.0000 mg | ORAL_CAPSULE | Freq: Every day | ORAL | Status: DC
  Administered 2013-10-18 – 2013-10-19 (×2): 60 mg via ORAL
  Filled 2013-10-18 (×2): qty 1

## 2013-10-18 MED ORDER — PANTOPRAZOLE SODIUM 40 MG PO TBEC
40.0000 mg | DELAYED_RELEASE_TABLET | Freq: Two times a day (BID) | ORAL | Status: DC
  Administered 2013-10-18 – 2013-10-19 (×3): 40 mg via ORAL
  Filled 2013-10-18 (×2): qty 1

## 2013-10-18 MED ORDER — CARVEDILOL 12.5 MG PO TABS: 12.5000 mg | ORAL_TABLET | Freq: Two times a day (BID) | ORAL | Status: DC

## 2013-10-18 MED ORDER — ASPIRIN EC 81 MG PO TBEC
81.0000 mg | DELAYED_RELEASE_TABLET | Freq: Every day | ORAL | Status: DC
  Administered 2013-10-19: 81 mg via ORAL
  Filled 2013-10-18: qty 1

## 2013-10-18 MED ORDER — SODIUM CHLORIDE 0.9 % IJ SOLN
10.0000 mL | INTRAMUSCULAR | Status: DC | PRN
  Administered 2013-10-18 (×2): 10 mL
  Administered 2013-10-19: 20 mL

## 2013-10-18 MED ORDER — PHENYTOIN 50 MG PO CHEW: 100.0000 mg | CHEWABLE_TABLET | Freq: Two times a day (BID) | ORAL | Status: DC

## 2013-10-18 MED ORDER — LISINOPRIL 2.5 MG PO TABS
2.5000 mg | ORAL_TABLET | Freq: Every day | ORAL | Status: DC
  Administered 2013-10-19: 2.5 mg via ORAL
  Filled 2013-10-18 (×2): qty 1

## 2013-10-18 NOTE — Consult Note (Signed)
CONSULTATION NOTE  Reason for Consult: New dilated cardiomyopathy, a-fib  Requesting Physician: Dr. Sheran Fava   Cardiologist:   HPI: This is a 73 y.o. male with a past medical history significant for CAD and CABG in 1997 (unknown vessels), history of right MCA stroke, seizure disorder and metastatic colon CA with bladder extension. He was recently admitted to [facility] in [place] for seizure activity - he also had acute upper GI bleed due to Mallory-Weiss tear. He was found to have troponin elevation and was seen by Dr. Perry Mount with Cardiology. He recommended a stress test which was described as "nonischemic", however, systolic function was "severely depressed" - this comes from a discharge summary, although the actual stress report is not available. Apparently, a previous echocardiogram in 06/2012 showed an EF of 55-60%. He also has stage 3 CKD and a history of PAF on warfarin for anticoagulation. Coreg was started and he was maintained on aspirin, a statin and lisinopril.  He is again admitted with seizures. Echo performed her on 10/14/13 shows EF of 15%, moderately dilated ventricle with severe diffuse hypokinesis and moderate MR. Troponin has been elevated to 1.6 and decreased to 0.84. BNP is ~25K.  PMHx:  Past Medical History  Diagnosis Date  . Colon cancer 2002    with extension to bladder  . Stroke   . CAD (coronary artery disease)     s/p CABG  . GERD (gastroesophageal reflux disease)   . Chronic pain   . Seizure disorder    Past Surgical History  Procedure Laterality Date  . Colon surgery    . Coronary artery bypass graft      FAMHx: Family History  Problem Relation Age of Onset  . Heart disease Mother   . COPD Father     SOCHx:  reports that he quit smoking about 17 years ago. His smoking use included Cigarettes. He started smoking about 54 years ago. He smoked 1.00 pack per day. He does not have any smokeless tobacco history on file. He reports that he  does not drink alcohol or use illicit drugs.  ALLERGIES: No Known Allergies  ROS: Review of systems not obtained due to patient factors.  HOME MEDICATIONS: Prescriptions prior to admission  Medication Sig Dispense Refill  . carvedilol (COREG) 12.5 MG tablet Take 12.5 mg by mouth 2 (two) times daily with a meal.      . cholecalciferol (VITAMIN D) 1000 UNITS tablet Take 1,000 Units by mouth 2 (two) times daily.       Marland Kitchen diltiazem (CARTIA XT) 120 MG 24 hr capsule Take 120 mg by mouth daily.      . DULoxetine (CYMBALTA) 60 MG capsule Take 1 capsule (60 mg total) by mouth daily.  30 capsule  1  . ENSURE PLUS (ENSURE PLUS) LIQD Take 237 mLs by mouth 3 (three) times daily between meals.  5688 mL  24  . Ferrous Sulfate (IRON) 325 (65 FE) MG TABS Take 1 tablet by mouth daily.       Marland Kitchen levETIRAcetam (KEPPRA) 500 MG tablet Take 1 tablet (500 mg total) by mouth 2 (two) times daily.  60 tablet  11  . lisinopril (PRINIVIL,ZESTRIL) 2.5 MG tablet Take 2.5 mg by mouth daily.      . Multiple Vitamins-Iron (MULTIVITAMINS WITH IRON) TABS tablet Take 1 tablet by mouth daily.      Marland Kitchen oxyCODONE (OXY IR/ROXICODONE) 5 MG immediate release tablet Take 5 mg by mouth every 4 (four) hours as needed  for breakthrough pain.      . OxyCODONE (OXYCONTIN) 40 mg T12A 12 hr tablet Take 1 tablet (40 mg total) by mouth every 12 (twelve) hours.  60 tablet  0  . pantoprazole (PROTONIX) 40 MG tablet Take 1 tablet (40 mg total) by mouth 2 (two) times daily.  60 tablet  11  . pravastatin (PRAVACHOL) 40 MG tablet Take 1 tablet (40 mg total) by mouth daily.  30 tablet  11  . warfarin (COUMADIN) 2 MG tablet Take 2-4 mg by mouth daily. Take 2 tablets (4 mg) on Monday and Friday and 1 tablet (53m) on all other days        HOSPITAL MEDICATIONS: Prior to Admission:  Prescriptions prior to admission  Medication Sig Dispense Refill  . carvedilol (COREG) 12.5 MG tablet Take 12.5 mg by mouth 2 (two) times daily with a meal.      .  cholecalciferol (VITAMIN D) 1000 UNITS tablet Take 1,000 Units by mouth 2 (two) times daily.       .Marland Kitchendiltiazem (CARTIA XT) 120 MG 24 hr capsule Take 120 mg by mouth daily.      . DULoxetine (CYMBALTA) 60 MG capsule Take 1 capsule (60 mg total) by mouth daily.  30 capsule  1  . ENSURE PLUS (ENSURE PLUS) LIQD Take 237 mLs by mouth 3 (three) times daily between meals.  5688 mL  24  . Ferrous Sulfate (IRON) 325 (65 FE) MG TABS Take 1 tablet by mouth daily.       .Marland KitchenlevETIRAcetam (KEPPRA) 500 MG tablet Take 1 tablet (500 mg total) by mouth 2 (two) times daily.  60 tablet  11  . lisinopril (PRINIVIL,ZESTRIL) 2.5 MG tablet Take 2.5 mg by mouth daily.      . Multiple Vitamins-Iron (MULTIVITAMINS WITH IRON) TABS tablet Take 1 tablet by mouth daily.      .Marland KitchenoxyCODONE (OXY IR/ROXICODONE) 5 MG immediate release tablet Take 5 mg by mouth every 4 (four) hours as needed for breakthrough pain.      . OxyCODONE (OXYCONTIN) 40 mg T12A 12 hr tablet Take 1 tablet (40 mg total) by mouth every 12 (twelve) hours.  60 tablet  0  . pantoprazole (PROTONIX) 40 MG tablet Take 1 tablet (40 mg total) by mouth 2 (two) times daily.  60 tablet  11  . pravastatin (PRAVACHOL) 40 MG tablet Take 1 tablet (40 mg total) by mouth daily.  30 tablet  11  . warfarin (COUMADIN) 2 MG tablet Take 2-4 mg by mouth daily. Take 2 tablets (4 mg) on Monday and Friday and 1 tablet (297m on all other days        VITALS: Blood pressure 109/67, pulse 89, temperature 98.2 F (36.8 C), temperature source Oral, resp. rate 18, height 6' 2"  (1.88 m), weight 162 lb 8 oz (73.71 kg), SpO2 96.00%.  PHYSICAL EXAM: General appearance: alert and no distress Neck: no carotid bruit and no JVD Lungs: clear to auscultation bilaterally Heart: irregularly irregular rhythm and tachycardic Abdomen: soft, non-tender; bowel sounds normal; no masses,  no organomegaly Extremities: extremities normal, atraumatic, no cyanosis or edema Pulses: 2+ and symmetric Skin: Skin  color, texture, turgor normal. No rashes or lesions Neurologic: Mental status: Awake, answers questions  LABS: Results for orders placed during the hospital encounter of 10/11/13 (from the past 48 hour(s))  PROTIME-INR     Status: Abnormal   Collection Time    10/17/13  4:46 AM      Result Value  Ref Range   Prothrombin Time 25.6 (*) 11.6 - 15.2 seconds   INR 2.43 (*) 0.00 - 1.49  CBC     Status: Abnormal   Collection Time    10/17/13  4:46 AM      Result Value Ref Range   WBC 10.9 (*) 4.0 - 10.5 K/uL   RBC 3.47 (*) 4.22 - 5.81 MIL/uL   Hemoglobin 10.2 (*) 13.0 - 17.0 g/dL   HCT 30.3 (*) 39.0 - 52.0 %   MCV 87.3  78.0 - 100.0 fL   MCH 29.4  26.0 - 34.0 pg   MCHC 33.7  30.0 - 36.0 g/dL   RDW 17.7 (*) 11.5 - 15.5 %   Platelets 249  150 - 400 K/uL  BASIC METABOLIC PANEL     Status: Abnormal   Collection Time    10/17/13  4:46 AM      Result Value Ref Range   Sodium 146  137 - 147 mEq/L   Potassium 3.9  3.7 - 5.3 mEq/L   Comment: DELTA CHECK NOTED   Chloride 106  96 - 112 mEq/L   CO2 28  19 - 32 mEq/L   Glucose, Bld 108 (*) 70 - 99 mg/dL   BUN 9  6 - 23 mg/dL   Creatinine, Ser 1.02  0.50 - 1.35 mg/dL   Calcium 7.8 (*) 8.4 - 10.5 mg/dL   GFR calc non Af Amer 72 (*) >90 mL/min   GFR calc Af Amer 83 (*) >90 mL/min   Comment: (NOTE)     The eGFR has been calculated using the CKD EPI equation.     This calculation has not been validated in all clinical situations.     eGFR's persistently <90 mL/min signify possible Chronic Kidney     Disease.  MAGNESIUM     Status: None   Collection Time    10/17/13  4:46 AM      Result Value Ref Range   Magnesium 1.6  1.5 - 2.5 mg/dL  PHOSPHORUS     Status: Abnormal   Collection Time    10/17/13  4:46 AM      Result Value Ref Range   Phosphorus 1.8 (*) 2.3 - 4.6 mg/dL  PROTIME-INR     Status: Abnormal   Collection Time    10/18/13  3:45 AM      Result Value Ref Range   Prothrombin Time 25.1 (*) 11.6 - 15.2 seconds   INR 2.37 (*) 0.00  - 1.49    IMAGING: Dg Chest Port 1 View  10/18/2013   CLINICAL DATA:  Evaluate for interval change.  EXAM: PORTABLE CHEST - 1 VIEW  COMPARISON:  DG CHEST 1V PORT dated 10/16/2013  FINDINGS: Cardiac silhouette remains mildly enlarged, mediastinal silhouette is nonsuspicious, mildly calcified aortic knob. Status post median sternotomy. Similar mild interstitial prominence without pleural effusions or focal consolidations. Similarly elevated right hemidiaphragm.  Left internal jugular central venous catheter distal tip projects in proximal superior vena cava. No pneumothorax. Multiple EKG lines overlie the patient and may obscure subtle underlying pathology. Soft tissue planes and included osseous structures are unchanged.  IMPRESSION: Similar mild cardiomegaly and interstitial prominence may reflect pulmonary edema without focal consolidation.  No apparent change in left IJ line.   Electronically Signed   By: Elon Alas   On: 10/18/2013 06:58    HOSPITAL DIAGNOSES: Active Problems:   Seizure   Acute respiratory failure   Hypotension, unspecified   Altered mental status  Malnutrition of moderate degree   Dilated cardiomyopathy   Pulmonary edema   Metabolic acidosis, normal anion gap (NAG)   Acute systolic heart failure   Atrial fibrillation with RVR   IMPRESSION: 1. Dilated cardiomyopathy - EF 15% (recent non-ischemic NST reported) 2. PAF, now with RVR on warfarin 3. History of CABG in 1997 4. Metastatic colon CA 5. Recurrent seizures with history of Right MCA infarct  RECOMMENDATION: 1. Newer dilated cardiomyopathy with severely reduced LVEF of 15%. I would not recommend further work-up at this time. He has metastatic cancer which has been resistant to treatments. He is not a candidate for further invasive work-up.  I agree with plans for hospice evaluation. Given his high risk for atrial and ventricular thrombus, I would continue warfarin (but we may have to accept the bleeding risk  given his recent upper GI bleed). I would restart b-blocker to help with rate control. Agree with the addition of digoxin - goal level of 0.8 when therapeutic. Will need to monitor due to potential interaction with dilantin, which can decrease the digoxin level.    Thanks for consulting [place]. Nothing further to add at this time. Feel free to call with questions.  Time Spent Directly with Patient: 45 minutes  Pixie Casino, MD, [facility] Attending Cardiologist [place] 10/18/2013, 3:23 PM

## 2013-10-18 NOTE — Telephone Encounter (Signed)
LMTCB

## 2013-10-18 NOTE — Progress Notes (Addendum)
[facility] PROGRESS NOTE  [name] [name] XLK:[id number] DOB: 08-19-1940 DOA: 10/11/2013 PCP: Redge Gainer, MD  Assessment/Plan  Seizures, Initially had respiratory failure secondary to seizure. -  Appreciate neurology assistance -  Continue keppra and dilantin -  EEG unremarkable -  Outpatient neuro f/u  Acute respiratory failure due to acute (possibly on chronic) systolic heart failure, ejection fraction of 15% with severe hypokinesis, no previous mention of heart failure on medical history.   Required reintubation secondary to pulmonary edema. -  Continue oral lasix -  Cardiology consult -  Stop dilt and restart BB -  Daily weights and strict I.'s and O.'s -  D/c NTG patch  Coronary artery disease status post CABG and NSTEMI, peak troponin 1.6 on 5/2 -  Continue statin -  Reduce Aspirin to 81mg  since pateint also on warfarin -  Restart BB  A-fib with RVR, recurred this morning -  Continue coumadin, dosing per pharmacy, INR therapeutic -  Restart BB -  D/c dilt (contraindicated in severe heart failure, also BP somewhat low) -  Load with dig until cardiology has chance to assess  CKD stage 3 with nongap acidosis -  Started on oral bicarbonate >> this is additional salt  -  Stop oral bicarb and trend CO2  Hx of colon CA s/p urostomy and colostomy  Normocytic anemia, hgb stable  Diet:  regular Access:  PIV IVF:  OFF Proph:  warfarin  Code Status: DNR Family Communication: patient alone Disposition Plan: likely to SNF when medically ready   Consultants:  Neurology  Critical care  Cardiology  Procedures:  4/30 CT head: No acute process; large remote R MCA infarct  4/30 Admit with seizures  4/30 pCXR: Interstitial edema  4/30 lactic up, abx started, line placed, bolus given  5/01 Extubated  5/02 Required re-intubation. Edema pattern on CXR  5/03 CXR much improved. Extubated on NTG gtt. Advanced directives discussion with pt after extubation. Made  DNR/I  Antibiotics:  ceftaz 4/30 > 5/1  Cefepime 5/2 x 1  Vancomycin 5/2 x 1  HPI/Subjective:  NO acute compliants   Objective: Filed Vitals:   10/17/13 2139 10/17/13 2155 10/18/13 0620 10/18/13 1004  BP: 114/75 118/66 125/63 109/59  Pulse:  88 89   Temp:  98.1 F (36.7 C) 98.2 F (36.8 C)   TempSrc:  Oral Oral   Resp:  16 18   Height:      Weight:   73.71 kg (162 lb 8 oz)   SpO2:  96% 94%     Intake/Output Summary (Last 24 hours) at 10/18/13 1332 Last data filed at 10/18/13 1258  Gross per 24 hour  Intake   2350 ml  Output   1000 ml  Net   1350 ml   Filed Weights   10/16/13 0500 10/17/13 0616 10/18/13 0620  Weight: 72 kg (158 lb 11.7 oz) 72.213 kg (159 lb 3.2 oz) 73.71 kg (162 lb 8 oz)    Exam:   General:  Thin CM, No acute distress  HEENT:  NCAT, MMM  Cardiovascular:  IRRR and tachycardic, nl S1, S2 no mrg, 2+ pulses, warm extremities  Respiratory:  CTAB, no increased WOB  Abdomen:   NABS, soft, NT/ND, urostomy leaking, colostomy with loose brown stool  MSK:   Normal tone and bulk, no LEE  Neuro:  Grossly intact  Data Reviewed: Basic Metabolic Panel:  Recent Labs Lab 10/11/13 2252  10/12/13 0348  10/13/13 0500  10/14/13 0400 10/14/13 3664 10/15/13 0551 10/16/13 4034  10/17/13 0446  NA 140  --  138  < > 142  < > 142 143 143 144 146  K 4.2  < > 5.1  < > 3.5*  < > 3.3* 3.8 3.5* 2.7* 3.9  CL 106  --  106  < > 113*  < > 111 109 108 105 106  CO2 19  --  14*  < > 16*  < > 16* 13* 21 28 28   GLUCOSE 127*  --  143*  < > 114*  < > 92 208* 117* 103* 108*  BUN 20  --  20  < > 16  < > 12 14 11 9 9   CREATININE 1.60*  --  1.40*  < > 1.32  < > 1.14 1.27 1.11 1.06 1.02  CALCIUM 9.3  --  8.7  < > 7.8*  < > 8.2* 8.2* 7.7* 8.0* 7.8*  MG 1.7  --  2.0  --   --   --   --   --   --   --  1.6  PHOS 3.9  --   --   --  2.3  --   --   --   --   --  1.8*  < > = values in this interval not displayed. Liver Function Tests:  Recent Labs Lab 10/11/13 2252  10/13/13 0500 10/15/13 0551  AST 17 24 19   ALT 8 7 11   ALKPHOS 52 35* 37*  BILITOT 0.4 <0.2* 0.2*  PROT 8.1 5.8* 5.8*  ALBUMIN 3.7 2.6* 2.6*   No results found for this basename: LIPASE, AMYLASE,  in the last 168 hours No results found for this basename: AMMONIA,  in the last 168 hours CBC:  Recent Labs Lab 10/11/13 2252 10/13/13 0500 10/14/13 0400 10/15/13 0551 10/16/13 0420 10/17/13 0446  WBC 11.7* 9.0 10.6* 9.7 10.5 10.9*  NEUTROABS  --  5.6 7.5  --   --   --   HGB 12.0* 9.3* 9.9* 9.8* 10.1* 10.2*  HCT 35.5* 27.0* 28.4* 28.1* 29.1* 30.3*  MCV 86.6 85.7 84.8 84.9 85.6 87.3  PLT 321 227 255 259 291 249   Cardiac Enzymes:  Recent Labs Lab 10/12/13 0348  10/14/13 0145 10/14/13 0828 10/14/13 1245 10/14/13 1845 10/15/13 0552  CKTOTAL 139  --   --   --   --   --   --   TROPONINI 0.43*  < > 0.85* 1.07* 1.60* 1.34* 0.84*  < > = values in this interval not displayed. BNP (last 3 results)  Recent Labs  10/12/13 0348 10/14/13 0828 10/15/13 0552  PROBNP 2369.0* 28561.0* 24975.0*   CBG:  Recent Labs Lab 10/15/13 0813 10/15/13 1215 10/15/13 1603 10/15/13 2005 10/16/13 0800  GLUCAP 92 117* 116* 105* 112*    Recent Results (from the past 240 hour(s))  MRSA PCR SCREENING     Status: Abnormal   Collection Time    10/12/13  2:33 AM      Result Value Ref Range Status   MRSA by PCR POSITIVE (*) NEGATIVE Final   Comment:            The GeneXpert MRSA Assay (FDA     approved for NASAL specimens     only), is one component of a     comprehensive MRSA colonization     surveillance program. It is not     intended to diagnose MRSA     infection nor to guide or     monitor treatment  for     MRSA infections.     RESULT CALLED TO, READ BACK BY AND VERIFIED WITH:     Nelta Numbers RN [id number] GREEN R  URINE CULTURE     Status: None   Collection Time    10/12/13  2:54 AM      Result Value Ref Range Status   Specimen Description URINE, RANDOM   Final   Special  Requests NONE   Final   Culture  Setup Time     Final   Value: 10/12/2013 10:01     Performed at [facility]     Final   Value: 50,000 COLONIES/ML     Performed at [facility]   Culture     Final   Value: YEAST     Performed at [facility]   Report Status 10/13/2013 FINAL   Final  CULTURE, BLOOD (ROUTINE X 2)     Status: None   Collection Time    10/12/13  3:48 AM      Result Value Ref Range Status   Specimen Description BLOOD RIGHT HAND   Final   Special Requests BOTTLES DRAWN AEROBIC ONLY 1.5CC   Final   Culture  Setup Time     Final   Value: 10/12/2013 08:30     Performed at [facility]   Culture     Final   Value: NO GROWTH 5 DAYS     Performed at [facility]   Report Status 10/18/2013 FINAL   Final  CULTURE, BLOOD (ROUTINE X 2)     Status: None   Collection Time    10/12/13  5:42 AM      Result Value Ref Range Status   Specimen Description BLOOD LEFT HAND   Final   Special Requests BOTTLES DRAWN AEROBIC ONLY 4CC   Final   Culture  Setup Time     Final   Value: 10/12/2013 08:31     Performed at [facility]   Culture     Final   Value: NO GROWTH 5 DAYS     Performed at [facility]   Report Status 10/18/2013 FINAL   Final  CULTURE, RESPIRATORY (NON-EXPECTORATED)     Status: None   Collection Time    10/12/13  3:00 PM      Result Value Ref Range Status   Specimen Description ENDOTRACHEAL   Final   Special Requests Normal   Final   Gram Stain     Final   Value: ABUNDANT WBC PRESENT,BOTH PMN AND MONONUCLEAR     RARE SQUAMOUS EPITHELIAL CELLS PRESENT     MODERATE GRAM POSITIVE COCCI     IN PAIRS IN CLUSTERS     Performed at [facility]   Culture     Final   Value: Non-Pathogenic Oropharyngeal-type Flora Isolated.     Performed at [facility]   Report Status 10/14/2013 FINAL   Final  CULTURE, BLOOD (ROUTINE X 2)     Status: None   Collection Time    10/14/13  7:30 AM       Result Value Ref Range Status   Specimen Description BLOOD LEFT ARM   Final   Special Requests BOTTLES DRAWN AEROBIC ONLY 2CC   Final   Culture  Setup Time     Final   Value: 10/14/2013 14:10     Performed at [facility]  Final   Value:        BLOOD CULTURE RECEIVED NO GROWTH TO DATE CULTURE WILL BE HELD FOR 5 DAYS BEFORE ISSUING A FINAL NEGATIVE REPORT     Performed at [facility]   Report Status PENDING   Incomplete  CULTURE, BLOOD (ROUTINE X 2)     Status: None   Collection Time    10/14/13  7:35 AM      Result Value Ref Range Status   Specimen Description BLOOD LEFT HAND   Final   Special Requests BOTTLES DRAWN AEROBIC ONLY 3CC   Final   Culture  Setup Time     Final   Value: 10/14/2013 14:10     Performed at [facility]   Culture     Final   Value:        BLOOD CULTURE RECEIVED NO GROWTH TO DATE CULTURE WILL BE HELD FOR 5 DAYS BEFORE ISSUING A FINAL NEGATIVE REPORT     Performed at [facility]   Report Status PENDING   Incomplete     Studies: Dg Chest Port 1 View  10/18/2013   CLINICAL DATA:  Evaluate for interval change.  EXAM: PORTABLE CHEST - 1 VIEW  COMPARISON:  DG CHEST 1V PORT dated 10/16/2013  FINDINGS: Cardiac silhouette remains mildly enlarged, mediastinal silhouette is nonsuspicious, mildly calcified aortic knob. Status post median sternotomy. Similar mild interstitial prominence without pleural effusions or focal consolidations. Similarly elevated right hemidiaphragm.  Left internal jugular central venous catheter distal tip projects in proximal superior vena cava. No pneumothorax. Multiple EKG lines overlie the patient and may obscure subtle underlying pathology. Soft tissue planes and included osseous structures are unchanged.  IMPRESSION: Similar mild cardiomegaly and interstitial prominence may reflect pulmonary edema without focal consolidation.  No apparent change in left IJ line.   Electronically Signed   By: Elon Alas   On: 10/18/2013 06:58    Scheduled Meds: . antiseptic oral rinse  15 mL Mouth Rinse QID  . aspirin  325 mg Oral Daily  . chlorhexidine  15 mL Mouth Rinse BID  . diltiazem  60 mg Oral Q12H  . feeding supplement (ENSURE COMPLETE)  237 mL Oral BID BM  . furosemide  40 mg Oral Daily  . levETIRAcetam  1,000 mg Oral BID  . nitroGLYCERIN  0.1 mg Transdermal Daily  . OxyCODONE  20 mg Oral Q12H  . pantoprazole  40 mg Oral QAC breakfast  . pravastatin  40 mg Oral q1800  . sodium bicarbonate  650 mg Oral TID  . Warfarin - Pharmacist Dosing Inpatient   Does not apply q1800   Continuous Infusions: . sodium chloride 10 mL/hr at 10/13/13 0502  .  sodium bicarbonate  infusion 1000 mL 50 mL/hr at 10/18/13 1006    Active Problems:   Seizure   Acute respiratory failure   Hypotension, unspecified   Altered mental status   Malnutrition of moderate degree   Dilated cardiomyopathy   Pulmonary edema   Metabolic acidosis, normal anion gap (NAG)    Time spent: 30 min    [facility] Pager [phone]. If 7PM-7AM, please contact night-coverage at [email or web address], password [facility] 10/18/2013, 1:32 PM  LOS: 7 days

## 2013-10-18 NOTE — Progress Notes (Signed)
ANTICOAGULATION CONSULT NOTE - Follow Up Consult  Pharmacy Consult for Coumadin Indication: atrial fibrillation  No Known Allergies  Patient Measurements: Height: 6\' 2"  (188 cm) Weight: 162 lb 8 oz (73.71 kg) IBW/kg (Calculated) : 82.2  Vital Signs: Temp: 98.2 F (36.8 C) (05/06 0620) Temp src: Oral (05/06 0620) BP: 109/59 mmHg (05/06 1004) Pulse Rate: 89 (05/06 0620)  Labs:  Recent Labs  10/16/13 0420 10/17/13 0446 10/18/13 0345  HGB 10.1* 10.2*  --   HCT 29.1* 30.3*  --   PLT 291 249  --   LABPROT 34.9* 25.6* 25.1*  INR 3.65* 2.43* 2.37*  CREATININE 1.06 1.02  --    Estimated Creatinine Clearance: 69.2 ml/min (by C-G formula based on Cr of 1.02).  Assessment: 73 yo M on Coumadin PTA for PAF. No coumadin past 2 days d/t supratherapeutic INR.  INR 2.37  this morning.  CBC is stable.  No bleeding or complications noted. On dys 1 diet.  PTA = 2mg  qday except 4mg  MWF  Goal of Therapy:  INR 2-3 Monitor platelets by anticoagulation protocol: Yes   Plan:  1.  Coumadin 4 mg po x 1 dose today. 2. Continue daily PT/INR. 3. Consider resuming home meds: sodium bicarb, cymbalta, lisinopril. Coreg, iron, MVI and changing diltiazem to long acting  Rober Minion, PharmD., MS Clinical Pharmacist Pager:  [phone] Thank you for allowing pharmacy to be part of this patients care team. 10/18/2013 1:31 PM

## 2013-10-19 ENCOUNTER — Ambulatory Visit: Payer: Medicare Other | Admitting: Family Medicine

## 2013-10-19 DIAGNOSIS — I5021 Acute systolic (congestive) heart failure: Secondary | ICD-10-CM

## 2013-10-19 DIAGNOSIS — I4891 Unspecified atrial fibrillation: Secondary | ICD-10-CM

## 2013-10-19 LAB — BASIC METABOLIC PANEL
BUN: 16 mg/dL (ref 6–23)
CALCIUM: 8 mg/dL — AB (ref 8.4–10.5)
CO2: 31 mEq/L (ref 19–32)
CREATININE: 1.3 mg/dL (ref 0.50–1.35)
Chloride: 102 mEq/L (ref 96–112)
GFR, EST AFRICAN AMERICAN: 62 mL/min — AB (ref >90)
GFR, EST NON AFRICAN AMERICAN: 54 mL/min — AB (ref >90)
Glucose, Bld: 84 mg/dL (ref 70–99)
Potassium: 4 mEq/L (ref 3.7–5.3)
Sodium: 144 mEq/L (ref 137–147)

## 2013-10-19 LAB — CBC
HCT: 28.3 % — ABNORMAL LOW (ref 39.0–52.0)
Hemoglobin: 9.4 g/dL — ABNORMAL LOW (ref 13.0–17.0)
MCH: 29.8 pg (ref 26.0–34.0)
MCHC: 33.2 g/dL (ref 30.0–36.0)
MCV: 89.8 fL (ref 78.0–100.0)
PLATELETS: 255 10*3/uL (ref 150–400)
RBC: 3.15 MIL/uL — ABNORMAL LOW (ref 4.22–5.81)
RDW: 17.6 % — AB (ref 11.5–15.5)
WBC: 6.6 10*3/uL (ref 4.0–10.5)

## 2013-10-19 LAB — PROTIME-INR
INR: 2.45 — AB (ref 0.00–1.49)
Prothrombin Time: 25.8 seconds — ABNORMAL HIGH (ref 11.6–15.2)

## 2013-10-19 MED ORDER — PHENYTOIN SODIUM EXTENDED 100 MG PO CAPS: 100.0000 mg | ORAL_CAPSULE | Freq: Three times a day (TID) | ORAL | Status: DC

## 2013-10-19 MED ORDER — DIGOXIN 250 MCG PO TABS: 0.2500 mg | ORAL_TABLET | Freq: Every day | ORAL | Status: DC

## 2013-10-19 MED ORDER — OXYCODONE HCL ER 20 MG PO T12A: 20.0000 mg | EXTENDED_RELEASE_TABLET | Freq: Two times a day (BID) | ORAL | Status: DC

## 2013-10-19 MED ORDER — LEVETIRACETAM 1000 MG PO TABS: 1000.0000 mg | ORAL_TABLET | Freq: Two times a day (BID) | ORAL | Status: AC

## 2013-10-19 MED ORDER — OXYCODONE HCL 10 MG PO TABS: 10.0000 mg | ORAL_TABLET | ORAL | Status: DC | PRN

## 2013-10-19 MED ORDER — ASPIRIN 81 MG PO TBEC: 81.0000 mg | DELAYED_RELEASE_TABLET | Freq: Every day | ORAL | Status: DC

## 2013-10-19 MED ORDER — WARFARIN SODIUM 2 MG PO TABS
2.0000 mg | ORAL_TABLET | ORAL | Status: AC
  Administered 2013-10-19: 2 mg via ORAL
  Filled 2013-10-19: qty 1

## 2013-10-19 MED ORDER — PHENYTOIN 50 MG PO CHEW: 100.0000 mg | CHEWABLE_TABLET | Freq: Three times a day (TID) | ORAL | Status: DC

## 2013-10-19 MED ORDER — FUROSEMIDE 20 MG PO TABS: 20.0000 mg | ORAL_TABLET | Freq: Every day | ORAL | Status: DC

## 2013-10-19 NOTE — Progress Notes (Signed)
Physical Therapy Treatment Patient Details Name: [name] [name] MRN: [id number] DOB: [birth year] Today's Date: 10/19/2013    History of Present Illness [name] [name] is a 73 y.o. 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     PT Comments    Pt slowly progressing with PT. New D/C disposition updated; pt planning to D/C home today with 24/7 (A) from family and hospice care. Pt requires increased time for activities and has difficulty sequencing motor tasks. Pt is a fall risk due to cognitive deficits. Recommend pt ambulate with RW; however, pt refusing to D/C home with RW.   Follow Up Recommendations  Other (comment);Supervision/Assistance - 24 hour (pt being D/C with hospice care )     Equipment Recommendations  Cane (pt refusing need for RW)    Recommendations for Other Services       Precautions / Restrictions Precautions Precautions: Fall;Other (comment) (urostomy and colostomy bags) Restrictions Weight Bearing Restrictions: No    Mobility  Bed Mobility Overal bed mobility: Needs Assistance Bed Mobility: Supine to Sit     Supine to sit: Min assist;HOB elevated     General bed mobility comments: (A) to elevate trunk; requires incr time and has difficulty sequencing cues for motor tasks   Transfers Overall transfer level: Needs assistance Equipment used: Rolling walker (2 wheeled) Transfers: Sit to/from Stand Sit to Stand: Min assist         General transfer comment: performed sit to stand x 3 at EOB for cleaning and gown change. pt with difficulty following commands when given simple directions for donning/doffing gown and socks. pt with sway and bracing LEs on EOB with intial sit to stand; max mulitmodal cues for balance stratgies to prvent sway with sit to stand and on 2nd and 3rd attempt, pt had less sway and required min guard to min (A) for transfers; cues for  hand placement and sequencing with RW   Ambulation/Gait Ambulation/Gait assistance: Min guard Ambulation Distance (Feet): 18 Feet Assistive device: Rolling walker (2 wheeled) Gait Pattern/deviations: Step-through pattern;Decreased stride length;Narrow base of support;Trunk flexed Gait velocity: impulsively fast; unsafe speed; cues to slow down    General Gait Details: pt unsteady with gt and attempts to increase gt speed and unsafe velocity; pt with difficulty managing RW at times and req;uires min guard to faciliate proper negotiation for RW; max cues for safety throughout ; pt refusing to ambulate outside of room at this time    Stairs            Wheelchair Mobility    Modified Rankin (Stroke Patients Only)       Balance Overall balance assessment: Needs assistance Sitting-balance support: No upper extremity supported;Feet supported Sitting balance-Leahy Scale: Fair Sitting balance - Comments: tolerated sitting EOB ~10 min for hygiene and clothing change; was able to shift weight and (A) minimally with changing gown and socks  Postural control: Posterior lean Standing balance support: During functional activity;Bilateral upper extremity supported Standing balance-Leahy Scale: Poor Standing balance comment: pt relies heavily on UE support and bracing LEs posteriorly on bed to maintain balance; cues for safety with weightshifting                     Cognition Arousal/Alertness: Awake/alert Behavior During Therapy: Flat affect Overall Cognitive Status: No family/caregiver present to determine baseline cognitive functioning Area of Impairment: Orientation;Safety/judgement;Following commands;Memory;Awareness;Attention Orientation Level: Disoriented to;Time;Place Current Attention Level: Sustained Memory:  Decreased short-term memory Following Commands: Follows one step commands with increased time Safety/Judgement: Decreased awareness of deficits;Decreased awareness  of safety Awareness: Emergent        Exercises General Exercises - Lower Extremity Ankle Circles/Pumps: AROM;Both;10 reps;Supine Long Arc Quad: AROM;Both;10 reps;Seated    General Comments General comments (skin integrity, edema, etc.): pt adamantly refusing need for RW at this time       Pertinent Vitals/Pain C/o 7-8/10 pain in low back and Lt LE    Home Living                      Prior Function            PT Goals (current goals can now be found in the care plan section) Acute Rehab PT Goals Patient Stated Goal: pt. wants to go home PT Goal Formulation: With patient Time For Goal Achievement: 10/30/13 Potential to Achieve Goals: Fair Progress towards PT goals: Progressing toward goals    Frequency  Min 3X/week    PT Plan Discharge plan needs to be updated    Co-evaluation             End of Session Equipment Utilized During Treatment: Gait belt Activity Tolerance: Patient limited by fatigue Patient left: in chair;with call bell/phone within reach;with chair alarm set     Time: 1131-1155 PT Time Calculation (min): 24 min  Charges:  $Gait Training: 8-22 mins $Therapeutic Activity: 8-22 mins                    G Codes:      [place], PT 10/19/2013, 3:09 PM

## 2013-10-19 NOTE — Progress Notes (Signed)
Patient discharged to home with instructions given to son. 

## 2013-10-19 NOTE — Progress Notes (Signed)
ANTICOAGULATION CONSULT NOTE - Follow Up Consult  Pharmacy Consult for Coumadin Indication: atrial fibrillation  No Known Allergies  Patient Measurements: Height: 6\' 2"  (188 cm) Weight: 157 lb 1.6 oz (71.26 kg) IBW/kg (Calculated) : 82.2  Vital Signs: Temp: 97.3 F (36.3 C) (05/07 0535) Temp src: Oral (05/07 0535) BP: 145/67 mmHg (05/07 0535) Pulse Rate: 83 (05/07 0535)  Labs:  Recent Labs  10/17/13 0446 10/18/13 0345 10/19/13 0540  HGB 10.2*  --  9.4*  HCT 30.3*  --  28.3*  PLT 249  --  255  LABPROT 25.6* 25.1* 25.8*  INR 2.43* 2.37* 2.45*  CREATININE 1.02  --  1.30   Estimated Creatinine Clearance: 52.6 ml/min (by C-G formula based on Cr of 1.3).  Assessment: 73 yo M on Coumadin PTA for PAF.  INR 2.45 this morning is therapeutic. CBC is stable though Hgb down some.  No bleeding or complications noted. Patient is to be discharged home today with hospice care.  PTA = 2mg  qday except 4mg  every Monday and Friday  Goal of Therapy:  INR 2-3 Monitor platelets by anticoagulation protocol: Yes   Plan:  1. Coumadin 2 mg po x 1 dose today. Give now before discharge.  2. Continue daily PT/INR.   Nicole Cella, RPh Clinical Pharmacist Pager: [phone] Thank you for allowing pharmacy to be part of this patients care team. 10/19/2013 5:25 PM

## 2013-10-19 NOTE — Telephone Encounter (Signed)
Hospice Referral made to [facility]

## 2013-10-19 NOTE — Discharge Summary (Signed)
Physician Discharge Summary  [name] [name] [id number] DOB: [birth year] DOA: 10/11/2013  PCP: Redge Gainer, MD  Admit date: 10/11/2013 Discharge date: 10/19/2013  Recommendations for Outpatient Follow-up:  1. Home hospice set up by case management 2. PCP in 2 weeks.  Please check phenytoin level and dig level.  CBC and BMP to f/u anemia and kidney function. 3. INR check at next scheduled time (about 1 week) 4. Please refill prescriptions for seizure medications as follow up with neurology will not occur within 30 days 5. Please refill heart medications if patient cannot get f/u appointment with cardiology within 30 days  Discharge Diagnoses:  Principal Problem:   Seizure Active Problems:   Acute respiratory failure   Hypotension, unspecified   Altered mental status   Malnutrition of moderate degree   Dilated cardiomyopathy   Pulmonary edema   Metabolic acidosis, normal anion gap (NAG)   Acute systolic heart failure   Atrial fibrillation with RVR   Discharge Condition: stable improved  Diet recommendation: Healthy heart  Wt Readings from Last 3 Encounters:  10/19/13 71.26 kg (157 lb 1.6 oz)  09/25/13 74.844 kg (165 lb)  09/18/13 74.662 kg (164 lb 9.6 oz)    History of present illness:   The patient is a 73 year old male with history of seizure, stroke, metastatic colon cancer, paroxysmal atrial fibrillation who presented to the emergency department with seizures. His initial head CT was negative. He required intubation for airway protection and was admitted to the ICU by critical care.  Hospital Course:   Seizures, initially had respiratory failure secondary to seizures and treatment for seizures. Neurology was consult. CT demonstrated no acute changes. EEG was unremarkable. His Keppra dose was increased and he was started on phenytoin. He was intubated initially and after his seizures resolved, he was extubated.  Acute respiratory failure secondary to acute on chronic  systolic heart failure. His echocardiogram demonstrated an ejection fraction of 15% with severe hypokinesis. There was mention of decreased ejection fraction on a note from a couple months ago however the exact ejection fraction was not mentioned. Today after his initial extubation, he developed acute shortness of breath and required reintubation. His chest x-ray demonstrated pulmonary edema. He was started on IV Lasix and nitroglycerin, and he was eventually able to be extubated.  His weight at the time of discharge is 71 kg, and he is near euvolemia.  He was seen by cardiology who assisted with medication management.  His diltiazem was discontinued secondary to his severe heart Fahrenheit was started on dig ox and for rate control of atrial fibrillation.  Coronary artery disease status post CABG and NSTEMI, peak troponin of 1.6 on 5/2. This was likely a type II NSTEMI secondary to his seizure and pulmonary edema. A continued statin. His aspirin dose was reduced to 81 mg and he is also on warfarin. His beta blocker has been resumed.  A. fib with RVR, recurred several times during admission. His rate went trend to the 150s and 160s when he was in atrial fibrillation and remained in the 80s when he was in sinus rhythm. His rate seems to be well-controlled with digoxin and carvedilol. He should continue his Coumadin to reduce his risk of future strokes since he is alert he had a stroke.  CKD stage III with non-gap metabolic acidosis.  He was initially started on IV bicarbonate infusion and then transitioned to oral bicarbonate. His CO2 normalized. Is bicarbonate supplements were discontinued and his bicarbonate is now 31.  Colon cancer status post urostomy and colostomy. Patient should be referred to oncology. It appears according to his primary care notes that he has been given a referral and just needs to followup with them.  Iron deficiency anemia, and hemoglobin now 9.4 on the day of discharge. He  should have a repeat CBC done in approximately one week and further anemia workup if not already complete. No evidence of acute bleeding.  Continue iron supplementation.     Consultants:  Neurology  Critical care  Cardiology Procedures:  4/30 CT head: No acute process; large remote R MCA infarct  4/30 Admit with seizures  4/30 pCXR: Interstitial edema  4/30 lactic up, abx started, line placed, bolus given  5/01 Extubated  5/02 Required re-intubation. Edema pattern on CXR  5/03 CXR much improved. Extubated on NTG gtt. Advanced directives discussion with pt after extubation. Made DNR/I  Antibiotics:  ceftaz 4/30 > 5/1  Cefepime 5/2 x 1  Vancomycin 5/2 x 1   Discharge Exam: Filed Vitals:   10/19/13 0535  BP: 145/67  Pulse: 83  Temp: 97.3 F (36.3 C)  Resp: 20   Filed Vitals:   10/18/13 1400 10/18/13 1714 10/18/13 2114 10/19/13 0535  BP: 109/67 99/64 118/70 145/67  Pulse: 89 142 88 83  Temp: 98.2 F (36.8 C)  98 F (36.7 C) 97.3 F (36.3 C)  TempSrc: Oral  Oral Oral  Resp: 18  20 20   Height:      Weight:    71.26 kg (157 lb 1.6 oz)  SpO2: 96%  100% 97%    General: Thin CM, No acute distress, feels well  HEENT: NCAT, MMM  Cardiovascular: RRR, nl S1, S2 no mrg, 2+ pulses, warm extremities  Respiratory: CTAB, no increased WOB  Abdomen: NABS, soft, NT/ND, urostomy leaking clear urine, colostomy with loose brown stool  MSK: Normal tone and bulk, no LEE  Neuro: Grossly intact   Discharge Instructions      Discharge Orders   Future Appointments Provider Department Dept Phone   11/22/2013 1:00 PM [place], DO [facility] [place] [phone]   Future Orders Complete By Expires   (HEART FAILURE PATIENTS) Call MD:  Anytime you have any of the following symptoms: 1) 3 pound weight gain in 24 hours or 5 pounds in 1 week 2) shortness of breath, with or without a dry hacking cough 3) swelling in the hands, feet or stomach 4) if you have to sleep on extra  pillows at night in order to breathe.  As directed    Call MD for:  difficulty breathing, headache or visual disturbances  As directed    Call MD for:  extreme fatigue  As directed    Call MD for:  hives  As directed    Call MD for:  persistant dizziness or light-headedness  As directed    Call MD for:  persistant nausea and vomiting  As directed    Call MD for:  severe uncontrolled pain  As directed    Call MD for:  temperature >100.4  As directed    Diet - low sodium heart healthy  As directed    Discharge instructions  As directed    Increase activity slowly  As directed        Medication List    STOP taking these medications       CARTIA XT 120 MG 24 hr capsule  Generic drug:  diltiazem      TAKE these medications  aspirin 81 MG EC tablet  Take 1 tablet (81 mg total) by mouth daily.     carvedilol 12.5 MG tablet  Commonly known as:  COREG  Take 12.5 mg by mouth 2 (two) times daily with a meal.     cholecalciferol 1000 UNITS tablet  Commonly known as:  VITAMIN D  Take 1,000 Units by mouth 2 (two) times daily.     digoxin 0.25 MG tablet  Commonly known as:  LANOXIN  Take 1 tablet (0.25 mg total) by mouth daily.     DULoxetine 60 MG capsule  Commonly known as:  CYMBALTA  Take 1 capsule (60 mg total) by mouth daily.     ENSURE PLUS Liqd  Take 237 mLs by mouth 3 (three) times daily between meals.     furosemide 20 MG tablet  Commonly known as:  LASIX  Take 1 tablet (20 mg total) by mouth daily.     Iron 325 (65 FE) MG Tabs  Take 1 tablet by mouth daily.     levETIRAcetam 1000 MG tablet  Commonly known as:  KEPPRA  Take 1 tablet (1,000 mg total) by mouth 2 (two) times daily.     lisinopril 2.5 MG tablet  Commonly known as:  PRINIVIL,ZESTRIL  Take 2.5 mg by mouth daily.     multivitamins with iron Tabs tablet  Take 1 tablet by mouth daily.     OxyCODONE 20 mg T12a 12 hr tablet  Commonly known as:  OXYCONTIN  Take 1 tablet (20 mg total) by mouth  every 12 (twelve) hours.     Oxycodone HCl 10 MG Tabs  Take 1 tablet (10 mg total) by mouth every 4 (four) hours as needed for moderate pain, severe pain or breakthrough pain.     pantoprazole 40 MG tablet  Commonly known as:  PROTONIX  Take 1 tablet (40 mg total) by mouth 2 (two) times daily.     phenytoin 100 MG ER capsule  Commonly known as:  DILANTIN  Take 1 capsule (100 mg total) by mouth 3 (three) times daily.     pravastatin 40 MG tablet  Commonly known as:  PRAVACHOL  Take 1 tablet (40 mg total) by mouth daily.     warfarin 2 MG tablet  Commonly known as:  COUMADIN  Take 2-4 mg by mouth daily. Take 2 tablets (4 mg) on Monday and Friday and 1 tablet (2mg ) on all other days       Follow-up Information   Follow up with Redge Gainer, MD. Schedule an appointment as soon as possible for a visit in 2 weeks.   Specialty:  Family Medicine   Contact information:   [address] [place] [address] [phone]       Follow up with HILTY,Kenneth C, MD. Schedule an appointment as soon as possible for a visit in 1 month.   Specialty:  Cardiology   Contact information:   [address] [place] [address] [phone]        The results of significant diagnostics from this hospitalization (including imaging, microbiology, ancillary and laboratory) are listed below for reference.    Significant Diagnostic Studies: Ct Head Wo Contrast  10/11/2013   CLINICAL DATA:  Seizure  EXAM: CT HEAD WITHOUT CONTRAST  TECHNIQUE: Contiguous axial images were obtained from the base of the skull through the vertex without intravenous contrast.  COMPARISON:  Prior CT from 10/03/2013  FINDINGS: Extensive encephalomalacia throughout the right MCA territory again seen,  compatible with remote right MCA territory infarct. Apparent differences in area of the encephalomalacia as compared to the recent exam likely related to angulation of the gantry. Generalized atrophy again noted.  No acute intracranial hemorrhage or infarct. No mass lesion or midline shift. No extra-axial fluid collection. Atherosclerotic calcifications within the carotid siphons and bilateral vertebral arteries noted.  Calvarium is normal. Scalp soft tissues unremarkable. Orbits are within normal limits. The paranasal sinuses and mastoid air cells are well pneumatized and free of fluid.  IMPRESSION: 1. No acute intracranial process. 2. Large remote right MCA territory infarct.   Electronically Signed   By: Jeannine Boga M.D.   On: 10/11/2013 23:15   Dg Chest Port 1 View  10/18/2013   CLINICAL DATA:  Evaluate for interval change.  EXAM: PORTABLE CHEST - 1 VIEW  COMPARISON:  DG CHEST 1V PORT dated 10/16/2013  FINDINGS: Cardiac silhouette remains mildly enlarged, mediastinal silhouette is nonsuspicious, mildly calcified aortic knob. Status post median sternotomy. Similar mild interstitial prominence without pleural effusions or focal consolidations. Similarly elevated right hemidiaphragm.  Left internal jugular central venous catheter distal tip projects in proximal superior vena cava. No pneumothorax. Multiple EKG lines overlie the patient and may obscure subtle underlying pathology. Soft tissue planes and included osseous structures are unchanged.  IMPRESSION: Similar mild cardiomegaly and interstitial prominence may reflect pulmonary edema without focal consolidation.  No apparent change in left IJ line.   Electronically Signed   By: Elon Alas   On: 10/18/2013 06:58   Dg Chest Port 1 View  10/16/2013   CLINICAL DATA:  Respiratory failure.  EXAM: PORTABLE CHEST - 1 VIEW  COMPARISON:  DG CHEST 1V PORT dated 10/15/2013; DG CHEST 1V PORT dated 10/14/2013  FINDINGS: Interim extubation removal NG tube. Left IJ line in stable position. Stable cardiomegaly are pulmonary edema. No evidence progression. No pleural effusion. No pneumothorax.  IMPRESSION: 1. Interim extubation removal NG tube. Stable positioning of left IJ  tube. 2. Stable mild pulmonary edema.   Electronically Signed   By: Marcello Moores  Register   On: 10/16/2013 07:17   Dg Chest Port 1 View  10/15/2013   CLINICAL DATA:  Respiratory failure  EXAM: PORTABLE CHEST - 1 VIEW  COMPARISON:  10/14/2013  FINDINGS: The ET tube tip is above the carina. There is a left IJ catheter with tip in the projection of the SVC. Nasogastric tube tip is in the stomach. The heart size and mediastinal contours are within normal limits. There has been interval improvement in pulmonary edema. The visualized skeletal structures are unremarkable.  IMPRESSION: 1. Satisfactory stress set stable support apparatus. 2. Decrease in pulmonary edema.   Electronically Signed   By: Kerby Moors M.D.   On: 10/15/2013 08:00   Dg Chest Port 1 View  10/14/2013   CLINICAL DATA:  Assess edema.  EXAM: PORTABLE CHEST - 1 VIEW  COMPARISON:  10/13/2013  FINDINGS: Sequelae of prior CABG are again identified. Cardiac silhouette is mildly enlarged. Endotracheal and enteric tubes have been removed. Left jugular central venous catheter remains in place with tip overlying the upper SVC. Pulmonary vasculature is indistinct and there are increased patchy perihilar opacities. No definite pleural effusion or pneumothorax is identified.  IMPRESSION: 1. Interval extubation. 2. Worsening pulmonary edema.   Electronically Signed   By: Logan Bores   On: 10/14/2013 08:17   Dg Chest Port 1 View  10/14/2013   CLINICAL DATA:  Intubation  EXAM: PORTABLE CHEST - 1 VIEW  COMPARISON:  10/14/2013  FINDINGS: The ET tube tip is above the carina. There is a left IJ catheter with tip in the projection of the SVC. The heart size appears normal. Moderate diffuse pulmonary edema is unchanged from previous exam.  IMPRESSION: 1. Interval intubation with tip above the carina. 2. No change in pulmonary edema   Electronically Signed   By: Kerby Moors M.D.   On: 10/14/2013 07:47   Dg Chest Port 1 View  10/13/2013   CLINICAL DATA:  Pulmonary  edema.  Seizures.  Intubated.  EXAM: PORTABLE CHEST - 1 VIEW  COMPARISON:  Radiographs dated 10/12/2013  FINDINGS: Endotracheal tube and NG tube and left central venous catheter all appear in good position. Pulmonary edema has completely resolved. Minimal atelectasis at the right lung base laterally. Lungs are otherwise clear. No osseous abnormality.  IMPRESSION: Resolution of pulmonary edema. Minimal atelectasis at the right lung base.   Electronically Signed   By: Rozetta Nunnery M.D.   On: 10/13/2013 08:23   Dg Chest Port 1 View  10/12/2013   CLINICAL DATA:  Left IJ line placement.  EXAM: PORTABLE CHEST - 1 VIEW  COMPARISON:  Chest x-ray 10/12/2013.  FINDINGS: Endotracheal tube, left IJ tube, NG tube in good anatomic position. Mediastinum and hilar structures normal. Heart size normal. No pleural effusion or pneumothorax. No acute bony abnormality.  IMPRESSION: 1. Good line and tube positions. 2. No acute cardiopulmonary disease.   Electronically Signed   By: Marcello Moores  Register   On: 10/12/2013 12:54   Dg Chest Port 1 View  10/12/2013   CLINICAL DATA:  Assess endotracheal tube position  EXAM: PORTABLE CHEST - 1 VIEW  COMPARISON:  DG CHEST 1V PORT dated 08/27/2013  FINDINGS: The endotracheal tube tip lies approximately 3 cm above the crotch of the carina. The lungs are well-expanded. The interstitial markings are increased bilaterally. There is no pleural effusion or pneumothorax. The cardiopericardial silhouette is normal in size. The pulmonary vascularity is indistinct. There are 5 sternal wires visible. The observed portions of the bony thorax appear normal.  IMPRESSION: 1. The endotracheal tube appears be in reasonable position 3 cm above the crotch of the carina. 2. The pulmonary interstitial markings are increased. This may reflect pulmonary interstitial edema of cardiac or noncardiac cause. There is no alveolar pneumonia.   Electronically Signed   By: David  Martinique   On: 10/12/2013 01:25   Dg Abd Portable  1v  10/14/2013   CLINICAL DATA:  NG tube placement.  EXAM: PORTABLE ABDOMEN - 1 VIEW  COMPARISON:  10/12/2013  FINDINGS: Enteric tube is present with side hole overlying the gastric body and tip in the region of the distal stomach. Gas is present in nondilated loops of bowel. Calcification /residual oral contrast material in the right mid abdomen is unchanged. Surgical clips are present in the upper abdomen and pelvis. No acute osseous abnormality is identified. Left lower quadrant ostomy is noted.  IMPRESSION: Enteric tube as above with tip in the region of the distal stomach.   Electronically Signed   By: Logan Bores   On: 10/14/2013 12:25   Dg Abd Portable 1v  10/12/2013   CLINICAL DATA:  Orogastric tube placement  EXAM: PORTABLE ABDOMEN - 1 VIEW  COMPARISON:  None.  FINDINGS: OG tube crosses the gastroesophageal junction and extends inferiorly with its tip in the right mid abdomen. Based upon course and position,location of tip is most consistent with descending duodenum. There is note of a left lower quadrant  ostomy. There is a nonobstructive gas pattern.  IMPRESSION: Orogastric tube as described above with tip projecting over the anticipated position of the descending duodenum.   Electronically Signed   By: Skipper Cliche M.D.   On: 10/12/2013 12:50    Microbiology: Recent Results (from the past 240 hour(s))  MRSA PCR SCREENING     Status: Abnormal   Collection Time    10/12/13  2:33 AM      Result Value Ref Range Status   MRSA by PCR POSITIVE (*) NEGATIVE Final   Comment:            The GeneXpert MRSA Assay (FDA     approved for NASAL specimens     only), is one component of a     comprehensive MRSA colonization     surveillance program. It is not     intended to diagnose MRSA     infection nor to guide or     monitor treatment for     MRSA infections.     RESULT CALLED TO, READ BACK BY AND VERIFIED WITH:     Nelta Numbers RN [id number] GREEN R  URINE CULTURE     Status: None    Collection Time    10/12/13  2:54 AM      Result Value Ref Range Status   Specimen Description URINE, RANDOM   Final   Special Requests NONE   Final   Culture  Setup Time     Final   Value: 10/12/2013 10:01     Performed at [place]     Final   Value: 50,000 COLONIES/ML     Performed at [facility]   Culture     Final   Value: YEAST     Performed at [facility]   Report Status 10/13/2013 FINAL   Final  CULTURE, BLOOD (ROUTINE X 2)     Status: None   Collection Time    10/12/13  3:48 AM      Result Value Ref Range Status   Specimen Description BLOOD RIGHT HAND   Final   Special Requests BOTTLES DRAWN AEROBIC ONLY 1.5CC   Final   Culture  Setup Time     Final   Value: 10/12/2013 08:30     Performed at [facility]   Culture     Final   Value: NO GROWTH 5 DAYS     Performed at [facility]   Report Status 10/18/2013 FINAL   Final  CULTURE, BLOOD (ROUTINE X 2)     Status: None   Collection Time    10/12/13  5:42 AM      Result Value Ref Range Status   Specimen Description BLOOD LEFT HAND   Final   Special Requests BOTTLES DRAWN AEROBIC ONLY 4CC   Final   Culture  Setup Time     Final   Value: 10/12/2013 08:31     Performed at [facility]   Culture     Final   Value: NO GROWTH 5 DAYS     Performed at [facility]   Report Status 10/18/2013 FINAL   Final  CULTURE, RESPIRATORY (NON-EXPECTORATED)     Status: None   Collection Time    10/12/13  3:00 PM      Result Value Ref Range Status   Specimen Description ENDOTRACHEAL   Final   Special Requests Normal   Final  Gram Stain     Final   Value: ABUNDANT WBC PRESENT,BOTH PMN AND MONONUCLEAR     RARE SQUAMOUS EPITHELIAL CELLS PRESENT     MODERATE GRAM POSITIVE COCCI     IN PAIRS IN CLUSTERS     Performed at [facility]   Culture     Final   Value: Non-Pathogenic Oropharyngeal-type Flora Isolated.     Performed at [facility]    Report Status 10/14/2013 FINAL   Final  CULTURE, BLOOD (ROUTINE X 2)     Status: None   Collection Time    10/14/13  7:30 AM      Result Value Ref Range Status   Specimen Description BLOOD LEFT ARM   Final   Special Requests BOTTLES DRAWN AEROBIC ONLY 2CC   Final   Culture  Setup Time     Final   Value: 10/14/2013 14:10     Performed at [facility]   Culture     Final   Value:        BLOOD CULTURE RECEIVED NO GROWTH TO DATE CULTURE WILL BE HELD FOR 5 DAYS BEFORE ISSUING A FINAL NEGATIVE REPORT     Performed at [facility]   Report Status PENDING   Incomplete  CULTURE, BLOOD (ROUTINE X 2)     Status: None   Collection Time    10/14/13  7:35 AM      Result Value Ref Range Status   Specimen Description BLOOD LEFT HAND   Final   Special Requests BOTTLES DRAWN AEROBIC ONLY 3CC   Final   Culture  Setup Time     Final   Value: 10/14/2013 14:10     Performed at [facility]   Culture     Final   Value:        BLOOD CULTURE RECEIVED NO GROWTH TO DATE CULTURE WILL BE HELD FOR 5 DAYS BEFORE ISSUING A FINAL NEGATIVE REPORT     Performed at [facility]   Report Status PENDING   Incomplete     Labs: Basic Metabolic Panel:  Recent Labs Lab 10/13/13 0500  10/14/13 0828 10/15/13 0551 10/16/13 0420 10/17/13 0446 10/19/13 0540  NA 142  < > 143 143 144 146 144  K 3.5*  < > 3.8 3.5* 2.7* 3.9 4.0  CL 113*  < > 109 108 105 106 102  CO2 16*  < > 13* 21 28 28 31   GLUCOSE 114*  < > 208* 117* 103* 108* 84  BUN 16  < > 14 11 9 9 16   CREATININE 1.32  < > 1.27 1.11 1.06 1.02 1.30  CALCIUM 7.8*  < > 8.2* 7.7* 8.0* 7.8* 8.0*  MG  --   --   --   --   --  1.6  --   PHOS 2.3  --   --   --   --  1.8*  --   < > = values in this interval not displayed. Liver Function Tests:  Recent Labs Lab 10/13/13 0500 10/15/13 0551  AST 24 19  ALT 7 11  ALKPHOS 35* 37*  BILITOT <0.2* 0.2*  PROT 5.8* 5.8*  ALBUMIN 2.6* 2.6*   No results found for this basename: LIPASE,  AMYLASE,  in the last 168 hours No results found for this basename: AMMONIA,  in the last 168 hours CBC:  Recent Labs Lab 10/13/13 0500 10/14/13 0400 10/15/13 0551 10/16/13 0420 10/17/13 0446 10/19/13 0540  WBC 9.0 10.6* 9.7 10.5 10.9* 6.6  NEUTROABS 5.6 7.5  --   --   --   --   HGB 9.3* 9.9* 9.8* 10.1* 10.2* 9.4*  HCT 27.0* 28.4* 28.1* 29.1* 30.3* 28.3*  MCV 85.7 84.8 84.9 85.6 87.3 89.8  PLT 227 255 259 291 249 255   Cardiac Enzymes:  Recent Labs Lab 10/14/13 0145 10/14/13 0828 10/14/13 1245 10/14/13 1845 10/15/13 0552  TROPONINI 0.85* 1.07* 1.60* 1.34* 0.84*   BNP: BNP (last 3 results)  Recent Labs  10/12/13 0348 10/14/13 0828 10/15/13 0552  PROBNP 2369.0* 28561.0* 24975.0*   CBG:  Recent Labs Lab 10/15/13 0813 10/15/13 1215 10/15/13 1603 10/15/13 2005 10/16/13 0800  GLUCAP 92 117* 116* 105* 112*    Time coordinating discharge: 45 minutes  Signed:  Janece Canterbury  [facility] 10/19/2013, 2:15 PM

## 2013-10-20 LAB — CULTURE, BLOOD (ROUTINE X 2)
CULTURE: NO GROWTH
Culture: NO GROWTH

## 2013-10-23 ENCOUNTER — Telehealth: Payer: Self-pay | Admitting: Pharmacist

## 2013-10-23 NOTE — Telephone Encounter (Signed)
Spoke with [employer] - nurse with [facility].  Will check INR 5/13 or 5/14.

## 2013-10-24 NOTE — ED Provider Notes (Addendum)
I saw and evaluated the patient, reviewed the resident's note and I agree with the findings and plan.   EKG Interpretation   Date/Time:  Wednesday October 11 2013 22:44:08 EDT Ventricular Rate:  114 PR Interval:  76 QRS Duration: 99 QT Interval:  346 QTC Calculation: 476 R Axis:   64 Text Interpretation:  Sinus or ectopic atrial tachycardia Ventricular  premature complex Borderline T abnormalities, diffuse leads Borderline  prolonged QT interval ED PHYSICIAN INTERPRETATION AVAILABLE IN [facility] Confirmed by TEST, Record ([id number]) on 10/13/2013 12:31:20 PM       Was present during the intubation performed by Dr. Jodi Mourning. I reviewed his note and I agree. Indication was respiratory failure. Post procedure x-ray confirmed proper placement. Saturations were maintained during the intubation. Positive end-tidal CO2 following intubation.   I have reviewed Dr. Jacelyn Grip note.  I agree.  I was present during his evaluation and treatment.  Please see my additional dictation.  Tanna Furry, MD 10/24/13 Dorthula Perfect  Tanna Furry, MD 10/25/13 [phone]

## 2013-10-25 ENCOUNTER — Other Ambulatory Visit: Payer: Self-pay | Admitting: *Deleted

## 2013-10-25 DIAGNOSIS — Z515 Encounter for palliative care: Secondary | ICD-10-CM

## 2013-10-27 ENCOUNTER — Other Ambulatory Visit: Payer: Medicare Other

## 2013-11-02 ENCOUNTER — Other Ambulatory Visit: Payer: Self-pay | Admitting: *Deleted

## 2013-11-02 MED ORDER — ASPIRIN 81 MG PO TBEC: 81.0000 mg | DELAYED_RELEASE_TABLET | Freq: Every day | ORAL | Status: AC

## 2013-11-02 MED ORDER — PHENYTOIN SODIUM EXTENDED 100 MG PO CAPS: 100.0000 mg | ORAL_CAPSULE | Freq: Three times a day (TID) | ORAL | Status: AC

## 2013-11-06 LAB — POCT INR: INR: 2.2

## 2013-11-09 ENCOUNTER — Telehealth: Payer: Self-pay | Admitting: Pharmacist

## 2013-11-09 NOTE — Telephone Encounter (Signed)
Called Beth to find out when last INR was done and schedule to recheck.  Had to leave message on VM.

## 2013-11-13 ENCOUNTER — Ambulatory Visit (INDEPENDENT_AMBULATORY_CARE_PROVIDER_SITE_OTHER): Payer: Medicare Other | Admitting: Pharmacist

## 2013-11-13 ENCOUNTER — Other Ambulatory Visit: Payer: Self-pay

## 2013-11-13 DIAGNOSIS — I6350 Cerebral infarction due to unspecified occlusion or stenosis of unspecified cerebral artery: Secondary | ICD-10-CM

## 2013-11-13 DIAGNOSIS — I639 Cerebral infarction, unspecified: Secondary | ICD-10-CM

## 2013-11-13 NOTE — Telephone Encounter (Signed)
Melissa From [facility] said last protime was done 11/06/13. PT 24.3 INR 2.2. Her # [phone]

## 2013-11-13 NOTE — Progress Notes (Signed)
INR entered from Hospice - no charge.

## 2013-11-15 ENCOUNTER — Other Ambulatory Visit: Payer: Self-pay | Admitting: *Deleted

## 2013-11-15 NOTE — Telephone Encounter (Signed)
Last ov 4/15. 

## 2013-11-16 MED ORDER — DIGOXIN 250 MCG PO TABS: 0.2500 mg | ORAL_TABLET | Freq: Every day | ORAL | Status: AC

## 2013-11-16 MED ORDER — FUROSEMIDE 20 MG PO TABS: 20.0000 mg | ORAL_TABLET | Freq: Every day | ORAL | Status: AC

## 2013-11-20 ENCOUNTER — Other Ambulatory Visit: Payer: Self-pay | Admitting: *Deleted

## 2013-11-20 MED ORDER — CARVEDILOL 12.5 MG PO TABS: 12.5000 mg | ORAL_TABLET | Freq: Two times a day (BID) | ORAL | Status: AC

## 2013-11-22 ENCOUNTER — Ambulatory Visit: Payer: Self-pay | Admitting: Neurology

## 2013-12-01 ENCOUNTER — Other Ambulatory Visit: Payer: Self-pay | Admitting: Family Medicine

## 2013-12-05 ENCOUNTER — Other Ambulatory Visit: Payer: Self-pay | Admitting: Family Medicine

## 2013-12-18 ENCOUNTER — Other Ambulatory Visit: Payer: Self-pay

## 2013-12-21 ENCOUNTER — Encounter: Payer: Self-pay | Admitting: *Deleted

## 2013-12-21 NOTE — Telephone Encounter (Signed)
This encounter was created in error - please disregard.

## 2013-12-23 ENCOUNTER — Other Ambulatory Visit: Payer: Self-pay | Admitting: Nurse Practitioner

## 2013-12-23 MED ORDER — OXYCODONE HCL 10 MG PO TABS: 10.0000 mg | ORAL_TABLET | ORAL | Status: DC | PRN

## 2013-12-26 ENCOUNTER — Telehealth: Payer: Self-pay | Admitting: Pharmacist

## 2013-12-26 NOTE — Telephone Encounter (Signed)
Called Hospice to place order for INR recheck - I was forwarded by [facility] to [employer].  Left message for INR protime to be drawn next time that see [name] [name]

## 2013-12-28 ENCOUNTER — Telehealth: Payer: Self-pay | Admitting: Family Medicine

## 2013-12-28 NOTE — Telephone Encounter (Signed)
appt scheduled for 7/31 with bill oxford

## 2013-12-29 ENCOUNTER — Other Ambulatory Visit: Payer: Self-pay | Admitting: Nurse Practitioner

## 2013-12-29 MED ORDER — OXYCODONE HCL 10 MG PO TABS: 10.0000 mg | ORAL_TABLET | ORAL | Status: DC | PRN

## 2013-12-29 NOTE — Telephone Encounter (Signed)
It appears that patietn has been discharged from Hospice.  He has apt 01/12/14 and will have INR checked at that appt.

## 2014-01-12 ENCOUNTER — Ambulatory Visit (INDEPENDENT_AMBULATORY_CARE_PROVIDER_SITE_OTHER): Payer: Medicare Other | Admitting: Family Medicine

## 2014-01-12 ENCOUNTER — Encounter: Payer: Self-pay | Admitting: Family Medicine

## 2014-01-12 VITALS — BP 110/65 | HR 111 | Temp 96.7°F | Ht 72.0 in | Wt 138.6 lb

## 2014-01-12 DIAGNOSIS — R5383 Other fatigue: Secondary | ICD-10-CM

## 2014-01-12 DIAGNOSIS — I639 Cerebral infarction, unspecified: Secondary | ICD-10-CM

## 2014-01-12 DIAGNOSIS — I4891 Unspecified atrial fibrillation: Secondary | ICD-10-CM

## 2014-01-12 DIAGNOSIS — I6350 Cerebral infarction due to unspecified occlusion or stenosis of unspecified cerebral artery: Secondary | ICD-10-CM

## 2014-01-12 DIAGNOSIS — G894 Chronic pain syndrome: Secondary | ICD-10-CM

## 2014-01-12 DIAGNOSIS — R5381 Other malaise: Secondary | ICD-10-CM

## 2014-01-12 DIAGNOSIS — J069 Acute upper respiratory infection, unspecified: Secondary | ICD-10-CM

## 2014-01-12 LAB — POCT CBC
Granulocyte percent: 84.8 %G — AB (ref 37–80)
HCT, POC: 36.2 % — AB (ref 43.5–53.7)
Hemoglobin: 11.6 g/dL — AB (ref 14.1–18.1)
Lymph, poc: 1 (ref 0.6–3.4)
MCH, POC: 29.8 pg (ref 27–31.2)
MCHC: 32.1 g/dL (ref 31.8–35.4)
MCV: 92.9 fL (ref 80–97)
MPV: 9 fL (ref 0–99.8)
POC Granulocyte: 7.9 — AB (ref 2–6.9)
POC LYMPH PERCENT: 10.4 %L (ref 10–50)
Platelet Count, POC: 361 10*3/uL (ref 142–424)
RBC: 3.9 M/uL — AB (ref 4.69–6.13)
RDW, POC: 17.2 %
WBC: 9.3 10*3/uL (ref 4.6–10.2)

## 2014-01-12 LAB — POCT INR: INR: 2.4

## 2014-01-12 MED ORDER — AMOXICILLIN 875 MG PO TABS: 875.0000 mg | ORAL_TABLET | Freq: Two times a day (BID) | ORAL | Status: DC

## 2014-01-12 MED ORDER — MIRTAZAPINE 15 MG PO TABS: 15.0000 mg | ORAL_TABLET | Freq: Every day | ORAL | Status: AC

## 2014-01-12 MED ORDER — FENTANYL 50 MCG/HR TD PT72: 50.0000 ug | MEDICATED_PATCH | TRANSDERMAL | Status: DC

## 2014-01-12 MED ORDER — OXYCODONE HCL 10 MG PO TABS: 10.0000 mg | ORAL_TABLET | ORAL | Status: DC | PRN

## 2014-01-12 NOTE — Progress Notes (Signed)
   Subjective:    Patient ID: [name] [name], male    DOB: 03/16/1941, 73 y.o.   MRN: [id number]  HPI Patient is here for follow up.  He has hx of bladder cancer, colon cancer, CAD, Afib, SZ disorder, HTN, CMO, CVA, colostomy, urostomy, malnutrition, weight loss, depression, and chronic pain due to DDD.  He had a sz in 4/15 and subsequent respiratory failure and was taken to [place] ED and was in ICU.  He was DC'd home on hospice.  Hospice has been taking care of him since.  He was DC'd from hospice yesterday.  He has been staying at home with his son and daughter in law.  He Is a DNR.  He has lost 30 pounds since being out of the hospital.  He denies depression but is flat and Does not interact or go out of the house. He states his only friend is a dog.  He is married and his wife left him because she couldn't take care of him and he was taken to a SNF in [place] where he had FTT and was then picked up by his son.  He states he is happier living with his son.  He has a urostomy and colostomy and the hospice nurses did not know how to care for this.   Review of Systems    No chest pain, SOB, HA, dizziness, vision change, N/V, diarrhea, constipation, dysuria, urinary urgency or frequency, myalgias, arthralgias or rash.  Objective:   Physical Exam  Vital signs noted  Chronically ill appearing cachetic male in NAD  HEENT - Head atraumatic Normocephalic                Eyes - PERRLA, Conjuctiva - clear Sclera- Clear EOMI                Ears - EAC's Wnl TM's Wnl Gross Hearing WNL                Throat - oropharanx wnl Respiratory - Lungs CTA bilateral Cardiac - RRR S1 and S2 without murmur GI - Abdomen soft Nontender and bowel sounds active x 4 Extremities- wasting      Assessment & Plan:  Chronic pain syndrome - Plan: fentaNYL (DURAGESIC - DOSED MCG/HR) 50 MCG/HR, Oxycodone HCl 10 MG TABS  Other malaise and fatigue - Plan: POCT CBC, CMP14+EGFR, Vitamin B12, TSH  Atrial  fibrillation, unspecified - Plan: POCT INR.  Rate controlled  URI, acute - Plan: amoxicillin (AMOXIL) 875 MG tablet po bid x 10 days  CVA (cerebral vascular accident) - He has left hemiparesis and he has left shoulder pain as a result of the CVA.  Follow up in 3 months  Home Health Consult to [facility] and they will go out and do assessment.  William J Oxford FNP 

## 2014-01-12 NOTE — Patient Instructions (Addendum)
Anticoagulation Dose Instructions as of 01/12/2014     [name] [name] Tue Wed Thu Fri Sat   New Dose 2 mg 4 mg 2 mg 2 mg 2 mg 4 mg 2 mg    Description       Continue 4mg  on mondays and fridays and 2mg  all other days. Planning to get Home Health for patient and they will take over checking INR's       INR was 2.4 today

## 2014-01-13 LAB — CMP14+EGFR
ALT: 11 IU/L (ref 0–44)
AST: 16 IU/L (ref 0–40)
Albumin/Globulin Ratio: 1.4 (ref 1.1–2.5)
Albumin: 4.1 g/dL (ref 3.5–4.8)
Alkaline Phosphatase: 115 IU/L (ref 39–117)
BUN/Creatinine Ratio: 11 (ref 10–22)
BUN: 20 mg/dL (ref 8–27)
CO2: 23 mmol/L (ref 18–29)
Calcium: 9.4 mg/dL (ref 8.6–10.2)
Chloride: 99 mmol/L (ref 97–108)
Creatinine, Ser: 1.87 mg/dL — ABNORMAL HIGH (ref 0.76–1.27)
GFR calc Af Amer: 41 mL/min/{1.73_m2} — ABNORMAL LOW (ref >59)
GFR calc non Af Amer: 35 mL/min/{1.73_m2} — ABNORMAL LOW (ref >59)
Globulin, Total: 2.9 g/dL (ref 1.5–4.5)
Glucose: 107 mg/dL — ABNORMAL HIGH (ref 65–99)
Potassium: 4.7 mmol/L (ref 3.5–5.2)
Sodium: 140 mmol/L (ref 134–144)
Total Bilirubin: 0.4 mg/dL (ref 0.0–1.2)
Total Protein: 7 g/dL (ref 6.0–8.5)

## 2014-01-13 LAB — TSH: TSH: 1.42 u[IU]/mL (ref 0.450–4.500)

## 2014-01-13 LAB — VITAMIN B12: Vitamin B-12: 376 pg/mL (ref 211–946)

## 2014-01-16 ENCOUNTER — Telehealth: Payer: Self-pay | Admitting: *Deleted

## 2014-01-16 ENCOUNTER — Other Ambulatory Visit: Payer: Self-pay | Admitting: Family Medicine

## 2014-01-16 DIAGNOSIS — I639 Cerebral infarction, unspecified: Secondary | ICD-10-CM

## 2014-01-16 NOTE — Telephone Encounter (Signed)
[facility] referral made per vo/ Stevan Born, FNP

## 2014-01-17 ENCOUNTER — Telehealth: Payer: Self-pay | Admitting: Family Medicine

## 2014-01-17 NOTE — Telephone Encounter (Signed)
I do not see prednisone on his med list and do not know why he is on it?  Would not like to rx this unless I know why?

## 2014-01-17 NOTE — Telephone Encounter (Signed)
[name] [name] wants to do a home health visit [facility] to do an INR on patient per his notes in the labs

## 2014-01-18 NOTE — Telephone Encounter (Signed)
It was last written at the beginning of July 2015. He was on hospice at the time and it could have been a standing order for them. He is no longer on hospice. The pharmacy is calling because the patient presented with prescription bottles for refills and this was one of them.  Tried to contact patient to see if he thought he needed it because he had an empty bottle or if he feels he has a need for it. Unable to speak with patient or family but I did leave a message to return my call.

## 2014-01-19 NOTE — Telephone Encounter (Signed)
This needs to be ordered with specific instructions by Zigmund Daniel or Rush Landmark, I made a referral to [facility], but not home health

## 2014-01-20 NOTE — Telephone Encounter (Signed)
[name] [name] can you order this please

## 2014-01-30 ENCOUNTER — Other Ambulatory Visit: Payer: Self-pay | Admitting: Family Medicine

## 2014-02-01 NOTE — Telephone Encounter (Signed)
Received refill request for omeprazole but patients current med list says protonix. Please advise on refill

## 2014-02-01 NOTE — Telephone Encounter (Signed)
It is okay to switch to Protonix

## 2014-02-01 NOTE — Telephone Encounter (Signed)
If the omeprazole is working and there is no increased cost would just leave him on omeprazole

## 2014-02-05 ENCOUNTER — Ambulatory Visit: Payer: Medicare Other | Admitting: Family Medicine

## 2014-02-06 ENCOUNTER — Telehealth: Payer: Self-pay | Admitting: *Deleted

## 2014-02-06 NOTE — Telephone Encounter (Signed)
Protime 12.8  INR 1.1 he has missed last 2 days, he normally takes 2 mg qd except 4 mg on M&F. Need new dose and when to recheck

## 2014-02-07 NOTE — Telephone Encounter (Signed)
Tammy can you please review, oxford didn't review as of today. Thanks

## 2014-02-07 NOTE — Telephone Encounter (Signed)
Patient missed 2 days due to delay in getting meds filled.  Increase dose to 4mg  MWF and 2mg  all other days.  Spoke with Liz Malady with [facility] and dosing instructions given and recheck INR in 1-2 days. Also left message on Camillia Sander's VM - patient's daughter in law - about dose change and when to expect next home visit.

## 2014-02-09 ENCOUNTER — Telehealth: Payer: Self-pay

## 2014-02-09 ENCOUNTER — Other Ambulatory Visit: Payer: Self-pay | Admitting: Family Medicine

## 2014-02-09 ENCOUNTER — Ambulatory Visit (INDEPENDENT_AMBULATORY_CARE_PROVIDER_SITE_OTHER): Payer: Medicare Other | Admitting: Pharmacist

## 2014-02-09 ENCOUNTER — Other Ambulatory Visit: Payer: Self-pay

## 2014-02-09 DIAGNOSIS — G894 Chronic pain syndrome: Secondary | ICD-10-CM

## 2014-02-09 DIAGNOSIS — I639 Cerebral infarction, unspecified: Secondary | ICD-10-CM

## 2014-02-09 DIAGNOSIS — I6350 Cerebral infarction due to unspecified occlusion or stenosis of unspecified cerebral artery: Secondary | ICD-10-CM

## 2014-02-09 LAB — POCT INR: INR: 1.2

## 2014-02-09 MED ORDER — OXYCODONE HCL 10 MG PO TABS: 10.0000 mg | ORAL_TABLET | ORAL | Status: DC | PRN

## 2014-02-09 NOTE — Telephone Encounter (Signed)
Last seen 01/12/14  B Oxford  If approved print and route to nurse  Last filled 01/12/14

## 2014-02-09 NOTE — Telephone Encounter (Signed)
INR subtherapeutic.  Patient instructed to take warfarin 2mg  3 tablets for next 2 days, then to increase dose to 4mg  or 2 tablets daily except on Mondays and Fridays take only 2mg  or 1 tablet.   Discussed dosing with patient's son and with home health nurse.  Recheck INR in 1 week - ordered through [facility] / Angie

## 2014-02-09 NOTE — Telephone Encounter (Signed)
INR 1.2  PT 14.1   Taking 2 mg everyday except 4 mg Mon Wed and Friday

## 2014-02-12 ENCOUNTER — Telehealth: Payer: Self-pay | Admitting: *Deleted

## 2014-02-12 DIAGNOSIS — I69959 Hemiplegia and hemiparesis following unspecified cerebrovascular disease affecting unspecified side: Secondary | ICD-10-CM

## 2014-02-12 DIAGNOSIS — Z8701 Personal history of pneumonia (recurrent): Secondary | ICD-10-CM

## 2014-02-12 DIAGNOSIS — IMO0001 Reserved for inherently not codable concepts without codable children: Secondary | ICD-10-CM

## 2014-02-12 DIAGNOSIS — I2510 Atherosclerotic heart disease of native coronary artery without angina pectoris: Secondary | ICD-10-CM

## 2014-02-12 NOTE — Telephone Encounter (Signed)
Left message for pt that RX for Oxycodone is ready for pick up Rx to front

## 2014-02-13 ENCOUNTER — Telehealth: Payer: Self-pay | Admitting: *Deleted

## 2014-02-13 ENCOUNTER — Telehealth: Payer: Self-pay | Admitting: Family Medicine

## 2014-02-13 ENCOUNTER — Other Ambulatory Visit: Payer: Self-pay | Admitting: Family Medicine

## 2014-02-13 DIAGNOSIS — G894 Chronic pain syndrome: Secondary | ICD-10-CM

## 2014-02-13 MED ORDER — CYPROHEPTADINE HCL 4 MG PO TABS: 4.0000 mg | ORAL_TABLET | Freq: Three times a day (TID) | ORAL | Status: AC | PRN

## 2014-02-13 MED ORDER — FENTANYL 50 MCG/HR TD PT72: 50.0000 ug | MEDICATED_PATCH | TRANSDERMAL | Status: DC

## 2014-02-13 NOTE — Telephone Encounter (Signed)
Per [facility] nurse, the family would like an appetite stimulant. He has used megace and he couldn't tolerate it. The family says he has used Cyproheptadine? I am not familiar with this

## 2014-02-13 NOTE — Telephone Encounter (Signed)
appt given for Friday with Rush Landmark

## 2014-02-13 NOTE — Telephone Encounter (Signed)
For Gina

## 2014-02-13 NOTE — Telephone Encounter (Signed)
Pt called into request a refill on Fentanyl patches Okayed per [employer] Rx to front for pt pick up

## 2014-02-13 NOTE — Telephone Encounter (Signed)
Sent rx of periactin for appetite to pharmacy

## 2014-02-14 ENCOUNTER — Ambulatory Visit (INDEPENDENT_AMBULATORY_CARE_PROVIDER_SITE_OTHER): Payer: Medicare Other | Admitting: Pharmacist

## 2014-02-14 DIAGNOSIS — I6350 Cerebral infarction due to unspecified occlusion or stenosis of unspecified cerebral artery: Secondary | ICD-10-CM

## 2014-02-14 DIAGNOSIS — I639 Cerebral infarction, unspecified: Secondary | ICD-10-CM

## 2014-02-14 LAB — POCT INR: INR: 2.7

## 2014-02-14 MED ORDER — WARFARIN SODIUM 2 MG PO TABS: 2.0000 mg | ORAL_TABLET | Freq: Every day | ORAL | Status: AC

## 2014-02-14 NOTE — Telephone Encounter (Signed)
INR was 2.7 today per [facility] with [place].  Spoke with daughter in law and per her over the last week patient received 6mg  for 2 days to help boost INR and then took warfarin 2mg  - 2 tablets (=4mg ) on mondays, wednesdays and fridays and 1 tablet (=2mg ) all other days. (was suppose to take 2mg  on mondays and fridays and 4mg  all other days) Will continue current warfarin dose of 4mg  on mondays, wednesdays and fridays and 4mg  all other days.  Recheck INR 02/21/14. Order given to [facility] for recheck.  Reminded Rosendo Gros to monitor for s/s of bleeding and to call office ASAP if notices any bleeding or excessive bruising.

## 2014-02-14 NOTE — Progress Notes (Signed)
Anticoagulation Dose Instructions as of 02/14/2014     [name] [name] Tue Wed Thu Fri Sat   New Dose 2 mg 4 mg 2 mg 4 mg 2 mg 4 mg 2 mg    Description       Continue warfarin 4mg  Mondays, Wednesdays and Fridays.  2mg  all other days.       INR checked at home by [address]..  Billing once per month interupertation fee.  Patient diagnosis - CVA Procedure code if [id number]

## 2014-02-16 ENCOUNTER — Encounter: Payer: Self-pay | Admitting: Family Medicine

## 2014-02-16 ENCOUNTER — Ambulatory Visit (INDEPENDENT_AMBULATORY_CARE_PROVIDER_SITE_OTHER): Payer: Medicare Other | Admitting: Family Medicine

## 2014-02-16 VITALS — BP 108/68 | HR 89 | Temp 96.5°F | Ht 72.0 in | Wt 136.8 lb

## 2014-02-16 DIAGNOSIS — M25512 Pain in left shoulder: Secondary | ICD-10-CM

## 2014-02-16 DIAGNOSIS — F0391 Unspecified dementia with behavioral disturbance: Secondary | ICD-10-CM

## 2014-02-16 DIAGNOSIS — F03918 Unspecified dementia, unspecified severity, with other behavioral disturbance: Secondary | ICD-10-CM

## 2014-02-16 DIAGNOSIS — M25519 Pain in unspecified shoulder: Secondary | ICD-10-CM

## 2014-02-16 DIAGNOSIS — J189 Pneumonia, unspecified organism: Secondary | ICD-10-CM

## 2014-02-16 MED ORDER — RISPERIDONE 0.25 MG PO TABS: 0.2500 mg | ORAL_TABLET | Freq: Every day | ORAL | Status: AC

## 2014-02-16 NOTE — Progress Notes (Signed)
   Subjective:    Patient ID: [name] [name], male    DOB: [birth year], 73 y.o.   MRN: [id number]  HPI This 73 y.o. male presents for evaluation of recent hospitalization for pneumonia. He has hx of CVA and has left hemiparesis. He has hx of atrial fibrillation and takes coumadin. He is not eating very well.  He is having problems with confusion and is starting to see things that are not there.  He has hx of bladder cancer and has urostomy tubes.  He has hx of colon cancer and has Colostomy bag.  He has hx of chronic pain and has left shoulder arthritis and pain.   Review of Systems C/o left shoulder pain.   No chest pain, SOB, HA, dizziness, vision change, N/V, diarrhea, constipation, dysuria, urinary urgency or frequency, myalgias, arthralgias or rash.  Objective:   Physical Exam Vital signs noted  Chronically ill appearing male  HEENT - Head atraumatic Normocephalic Respiratory - Lungs CTA bilateral Cardiac - RRR S1 and S2 without murmur GI - Abdomen soft Nontender and bowel sounds active x 4 Extremities - No edema. Neuro - Grossly intact. MS - TTP left shoulder  Procedure - 2 cc's lidocaine and one cc of depomedrol 80mg  posterior left shoulder injection.    Assessment & Plan:  Dementia with behavioral disturbance - Plan: risperiDONE (RISPERDAL) 0.25 MG tablet  CAP (community acquired pneumonia) - Resolving  Left shoulder pain - Posterior shoulder injection  Lysbeth Penner FNP

## 2014-02-21 ENCOUNTER — Ambulatory Visit (INDEPENDENT_AMBULATORY_CARE_PROVIDER_SITE_OTHER): Payer: Medicare Other | Admitting: Pharmacist

## 2014-02-21 ENCOUNTER — Telehealth: Payer: Self-pay | Admitting: *Deleted

## 2014-02-21 DIAGNOSIS — I6350 Cerebral infarction due to unspecified occlusion or stenosis of unspecified cerebral artery: Secondary | ICD-10-CM

## 2014-02-21 DIAGNOSIS — I639 Cerebral infarction, unspecified: Secondary | ICD-10-CM

## 2014-02-21 LAB — POCT INR: INR: 2.9

## 2014-02-21 NOTE — Telephone Encounter (Signed)
INR therapeutic - continue current warfarin dose of 4mg  MWF and 2mg  all other days.  Recheck INR in 1 week . Patient's son and [facility] notified.

## 2014-02-21 NOTE — Telephone Encounter (Signed)
PT 35.9  INR 2.9 Takes 2mg  on Monday and Friday, and 4 mg all other days

## 2014-02-28 ENCOUNTER — Ambulatory Visit (INDEPENDENT_AMBULATORY_CARE_PROVIDER_SITE_OTHER): Payer: Medicare Other | Admitting: Pharmacist

## 2014-02-28 ENCOUNTER — Telehealth: Payer: Self-pay | Admitting: *Deleted

## 2014-02-28 DIAGNOSIS — I639 Cerebral infarction, unspecified: Secondary | ICD-10-CM

## 2014-02-28 DIAGNOSIS — I6350 Cerebral infarction due to unspecified occlusion or stenosis of unspecified cerebral artery: Secondary | ICD-10-CM

## 2014-02-28 LAB — POCT INR: INR: 4.2

## 2014-02-28 NOTE — Telephone Encounter (Signed)
Protime 49.8  INR 4.2  Takes 4 mg M,W,F, 2 mg all other days

## 2014-02-28 NOTE — Telephone Encounter (Signed)
No warfarin today or tomorrow, then start new dose of 4mg  on mondays and 2mg  all other day. Recheck in 1 week.  Thayer Headings with [facility] notified and order given. Also called patient's home with instructions and when to recheck.

## 2014-03-08 ENCOUNTER — Telehealth: Payer: Self-pay | Admitting: *Deleted

## 2014-03-08 ENCOUNTER — Ambulatory Visit (INDEPENDENT_AMBULATORY_CARE_PROVIDER_SITE_OTHER): Payer: Medicare Other | Admitting: Pharmacist

## 2014-03-08 DIAGNOSIS — I6350 Cerebral infarction due to unspecified occlusion or stenosis of unspecified cerebral artery: Secondary | ICD-10-CM

## 2014-03-08 DIAGNOSIS — I639 Cerebral infarction, unspecified: Secondary | ICD-10-CM

## 2014-03-08 LAB — POCT INR: INR: 2

## 2014-03-08 NOTE — Telephone Encounter (Signed)
INR: 2.0 4 mg every Monday and 2 mg all other days.

## 2014-03-08 NOTE — Telephone Encounter (Signed)
Patient to continue current warfarin dose of 4mg  every Monday and 2mg  all other days. Recheck INR in 1 week.  Order given to elisa at [facility] and patinet's family called with dosing recommendations.

## 2014-04-04 ENCOUNTER — Telehealth: Payer: Self-pay | Admitting: Pharmacist

## 2014-04-04 NOTE — Telephone Encounter (Signed)
Called b/c patient needed recheck INR.  Spoke with his son.  Patient was in hospital recently and then transferred to [facility].  He is currently at the [facility] and the family is working with a Education officer, museum to get him into assisted living at [facility].  [facility] is checking INR.

## 2014-04-09 ENCOUNTER — Telehealth: Payer: Self-pay | Admitting: *Deleted

## 2014-04-09 NOTE — Telephone Encounter (Signed)
Spoke with [place] from Hospice. He states that patient has recently been at the [facility] for rehab and has declined very rapidly. The son called hospice and would like them to come in to see him. I gave verbal order for them to go out and assess today. FYI

## 2014-04-10 ENCOUNTER — Other Ambulatory Visit: Payer: Self-pay | Admitting: Family Medicine

## 2014-04-10 ENCOUNTER — Telehealth: Payer: Self-pay | Admitting: Family Medicine

## 2014-04-10 NOTE — Telephone Encounter (Signed)
Yes dc coumadin and anticoagulants and comfort care only

## 2014-04-10 NOTE — Telephone Encounter (Signed)
They are aware

## 2014-04-18 ENCOUNTER — Telehealth: Payer: Self-pay | Admitting: *Deleted

## 2014-04-18 ENCOUNTER — Other Ambulatory Visit: Payer: Self-pay | Admitting: *Deleted

## 2014-04-18 DIAGNOSIS — G894 Chronic pain syndrome: Secondary | ICD-10-CM

## 2014-04-18 MED ORDER — OXYCODONE HCL ER 60 MG PO T12A: 60.0000 mg | EXTENDED_RELEASE_TABLET | Freq: Two times a day (BID) | ORAL | Status: AC

## 2014-04-18 MED ORDER — OXYCODONE-ACETAMINOPHEN 7.5-325 MG PO TABS: 1.0000 | ORAL_TABLET | Freq: Three times a day (TID) | ORAL | Status: AC | PRN

## 2014-04-18 MED ORDER — FENTANYL 50 MCG/HR TD PT72: 50.0000 ug | MEDICATED_PATCH | TRANSDERMAL | Status: AC

## 2014-04-18 MED ORDER — LORAZEPAM 1 MG PO TABS: 1.0000 mg | ORAL_TABLET | Freq: Four times a day (QID) | ORAL | Status: AC | PRN

## 2014-04-18 NOTE — Telephone Encounter (Signed)
Bill signed all rx's and I faxed to [facility] (hospice pt). Scanned in original rxs

## 2014-04-18 NOTE — Telephone Encounter (Signed)
Hospice nurse says pt came back from hospital yesterday and needs prescriptions for these meds. Oxycontin 60 mg po q 12 hours, oxycodone 7.5/325 po tid prn, fentenyl patch 50 mcg. Every 72 hours, and lorazepam 1 mg po every 6 hours prn. They all need to go to [facility] and have hospice written on them

## 2014-04-23 ENCOUNTER — Other Ambulatory Visit: Payer: Self-pay | Admitting: Pharmacist

## 2014-04-23 ENCOUNTER — Telehealth: Payer: Self-pay | Admitting: *Deleted

## 2014-04-23 NOTE — Telephone Encounter (Signed)
May discontinue medications as directed and taper off of Coreg.

## 2014-04-23 NOTE — Telephone Encounter (Signed)
Dr. Laurance Flatten will write for Liquid morphine and Atropine opth. Hopsice nurse aware and she will discuss with family.

## 2014-04-23 NOTE — Telephone Encounter (Signed)
[facility] of Liquid morphine. Please order if approved. Needs for SOB and pain q1-2 hrs. Wants to d/c lisinopril and Coreg for low B/P. Print RX for morphine and call pt family when ready. Call hospice nurse back and let her know. Hospice nurse said liquid morphine dose is 20mg /ml. Give 0.25 ml q1-2 hrs prn. Route to nurse pool when done.

## 2014-04-26 ENCOUNTER — Other Ambulatory Visit (INDEPENDENT_AMBULATORY_CARE_PROVIDER_SITE_OTHER): Payer: Medicare Other | Admitting: Pharmacist

## 2014-04-26 DIAGNOSIS — I639 Cerebral infarction, unspecified: Secondary | ICD-10-CM

## 2014-05-15 DEATH — deceased

## 2014-05-21 ENCOUNTER — Ambulatory Visit: Payer: Medicare Other | Admitting: Family Medicine

## 2014-07-28 IMAGING — CT CT HEAD W/O CM
2 series · 16 of 30 positions shown, 18 images · non-contrast
Comparison: Prior CT from 10/03/2013

CLINICAL DATA: Seizure

EXAM:
CT HEAD WITHOUT CONTRAST
TECHNIQUE: Contiguous axial images were obtained from the base of the skull
through the vertex without intravenous contrast.

[Series 201: head w/o, idose (1) · axial · non-contrast · 0.49mm/px · z∈[+71,+206]mm · 8 of 35 slices shown, 10 images]
[im 4/35  brain]
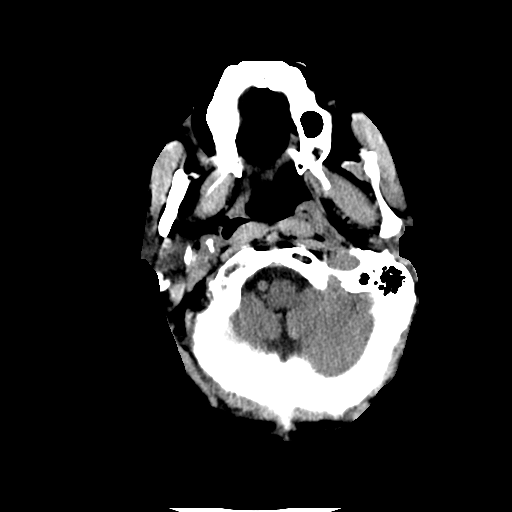
[im 4/35  bone]
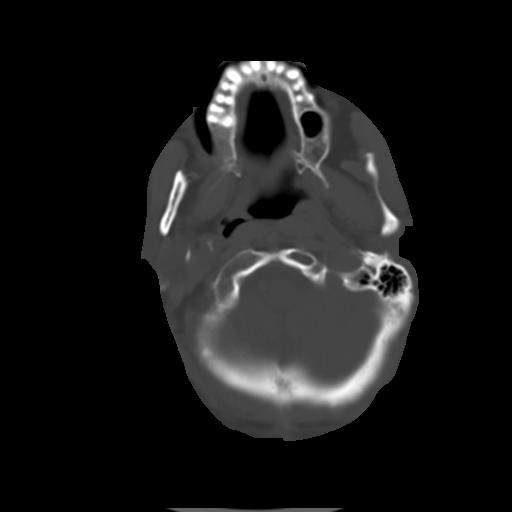
[im 8/35  brain]
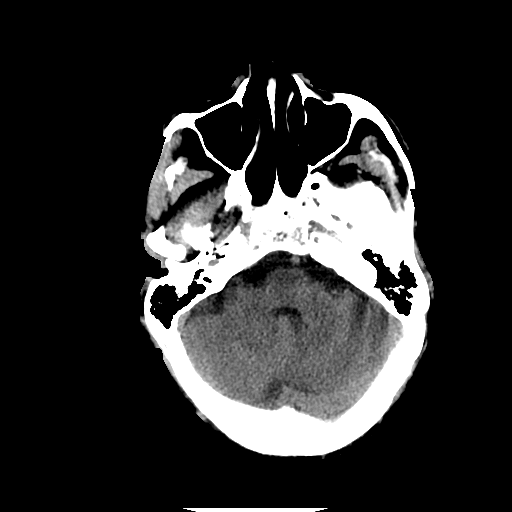
[im 12/35  brain]
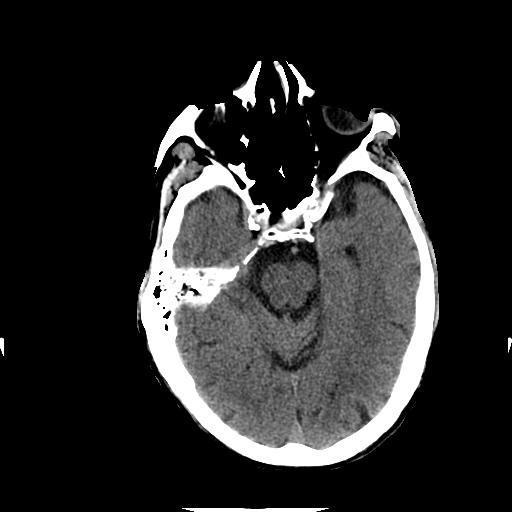
[im 16/35  brain]
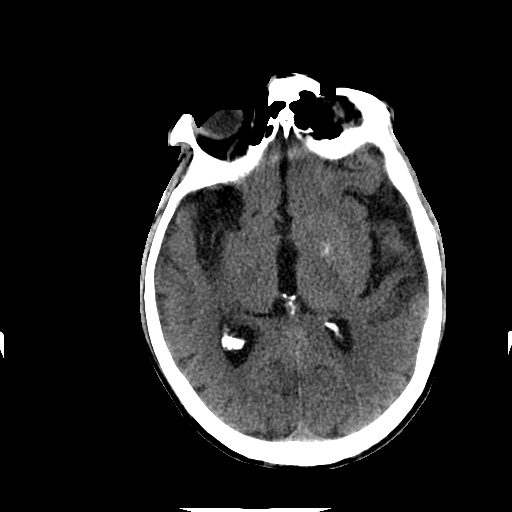
[im 19/35  brain]
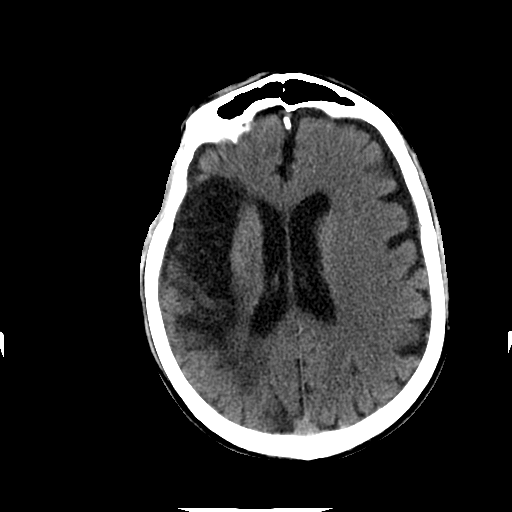
[im 19/35  bone]
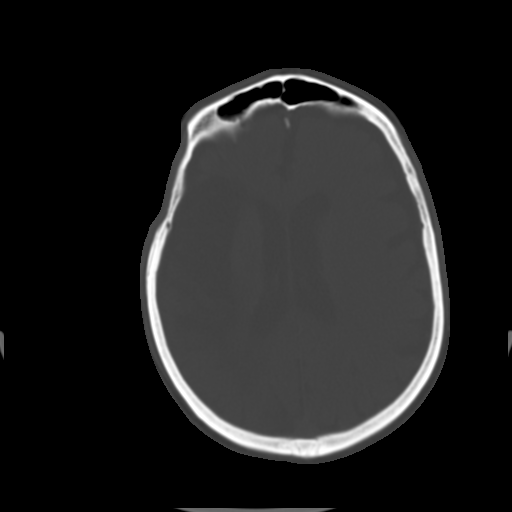
[im 23/35  brain]
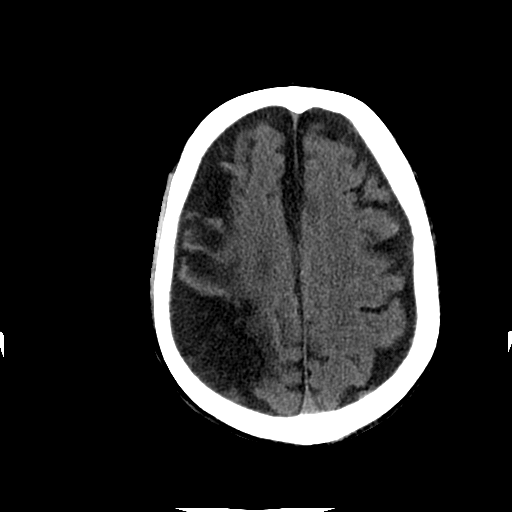
[im 27/35  brain]
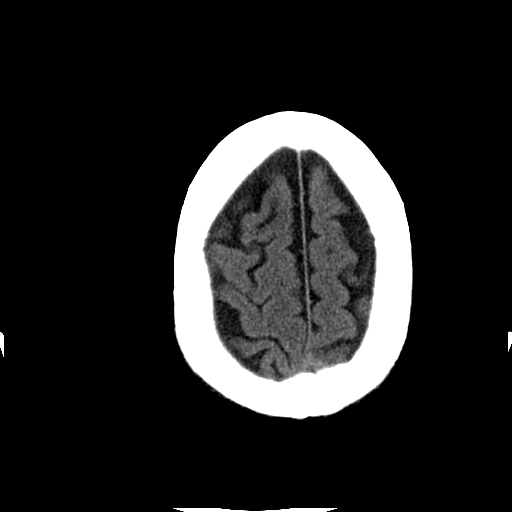
[im 31/35  brain]
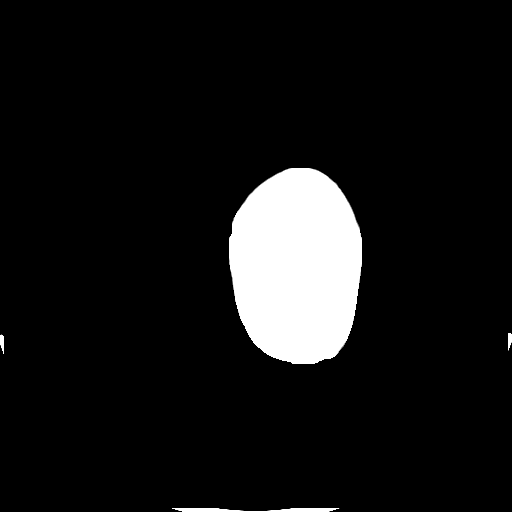

[Series 202: head w/o bone, idose (1) · axial · non-contrast · 0.49mm/px · z∈[+73,+200]mm · 8 of 70 slices shown]
[im 8/70  bone]
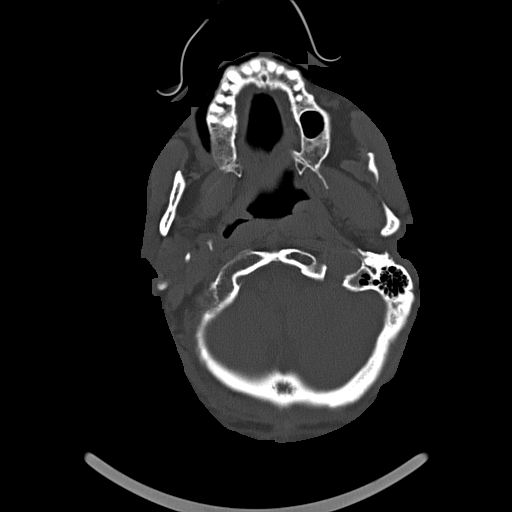
[im 15/70  bone]
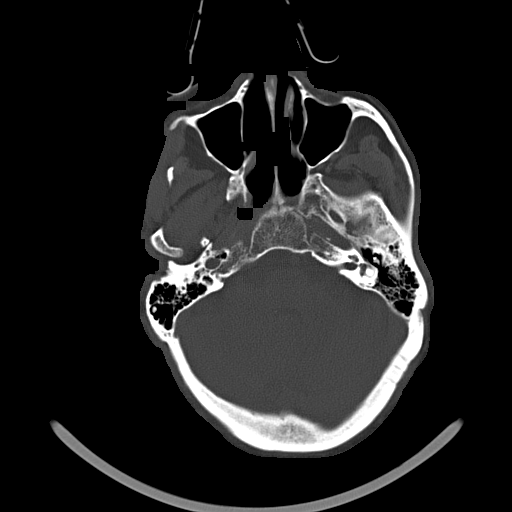
[im 22/70  bone]
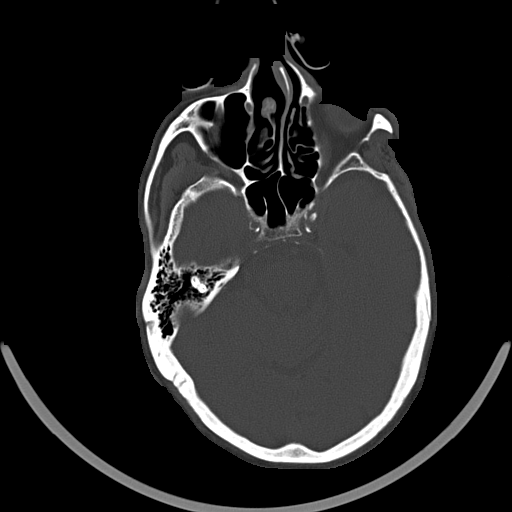
[im 30/70  bone]
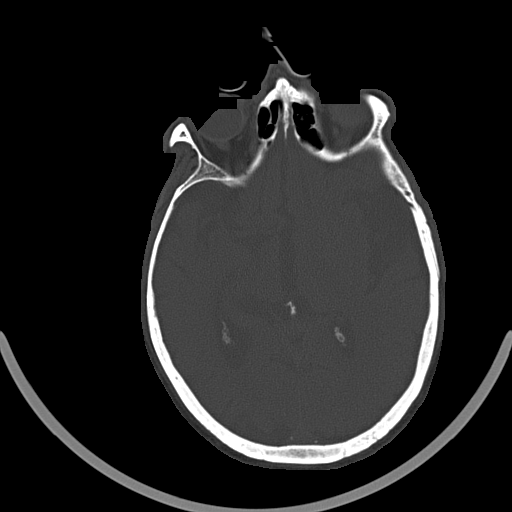
[im 37/70  bone]
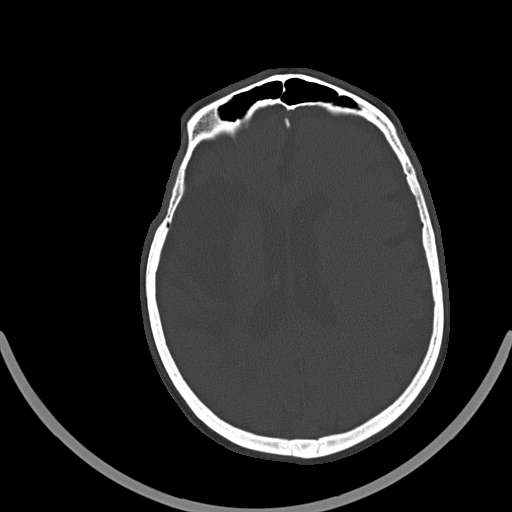
[im 44/70  bone]
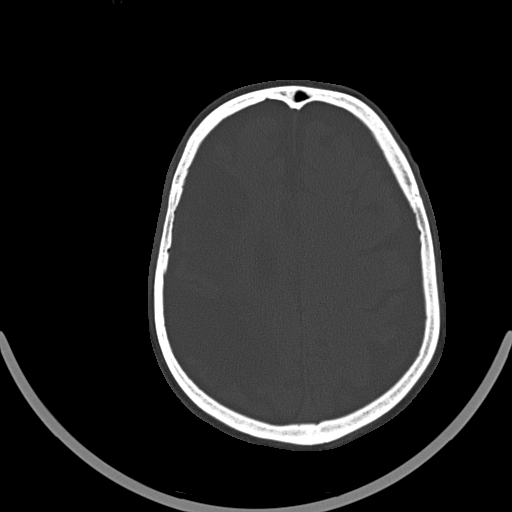
[im 51/70  bone]
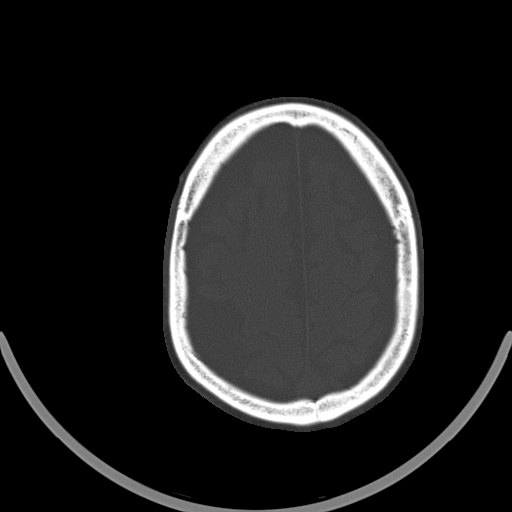
[im 59/70  bone]
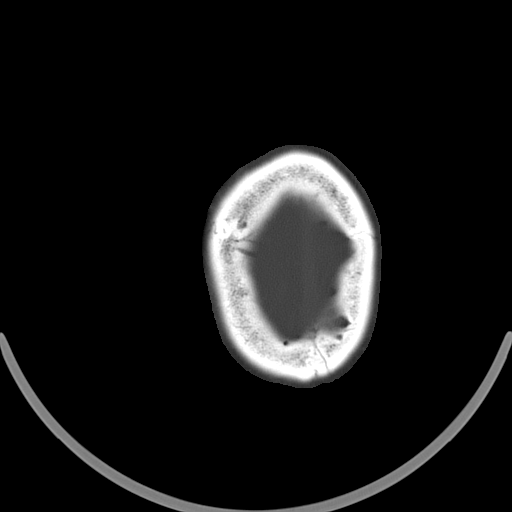

[16 of 30 positions shown; findings below may reference images not displayed]

FINDINGS: Extensive encephalomalacia throughout the right MCA territory again
seen, compatible with remote right MCA territory infarct. Apparent
differences in area of the encephalomalacia as compared to the
recent exam likely related to angulation of the gantry. Generalized
atrophy again noted. No acute intracranial hemorrhage or infarct. No
mass lesion or midline shift. No extra-axial fluid collection.
Atherosclerotic calcifications within the carotid siphons and
bilateral vertebral arteries noted.

Calvarium is normal. Scalp soft tissues unremarkable. Orbits are
within normal limits. The paranasal sinuses and mastoid air cells
are well pneumatized and free of fluid.
IMPRESSION: 1. No acute intracranial process.
2. Large remote right MCA territory infarct.

## 2014-07-29 IMAGING — CR DG CHEST 1V PORT
1 series · 1 of 1 positions shown · non-contrast
Comparison: Chest x-ray 10/12/2013.

CLINICAL DATA: Left IJ line placement.

EXAM:
PORTABLE CHEST - 1 VIEW

[AP]
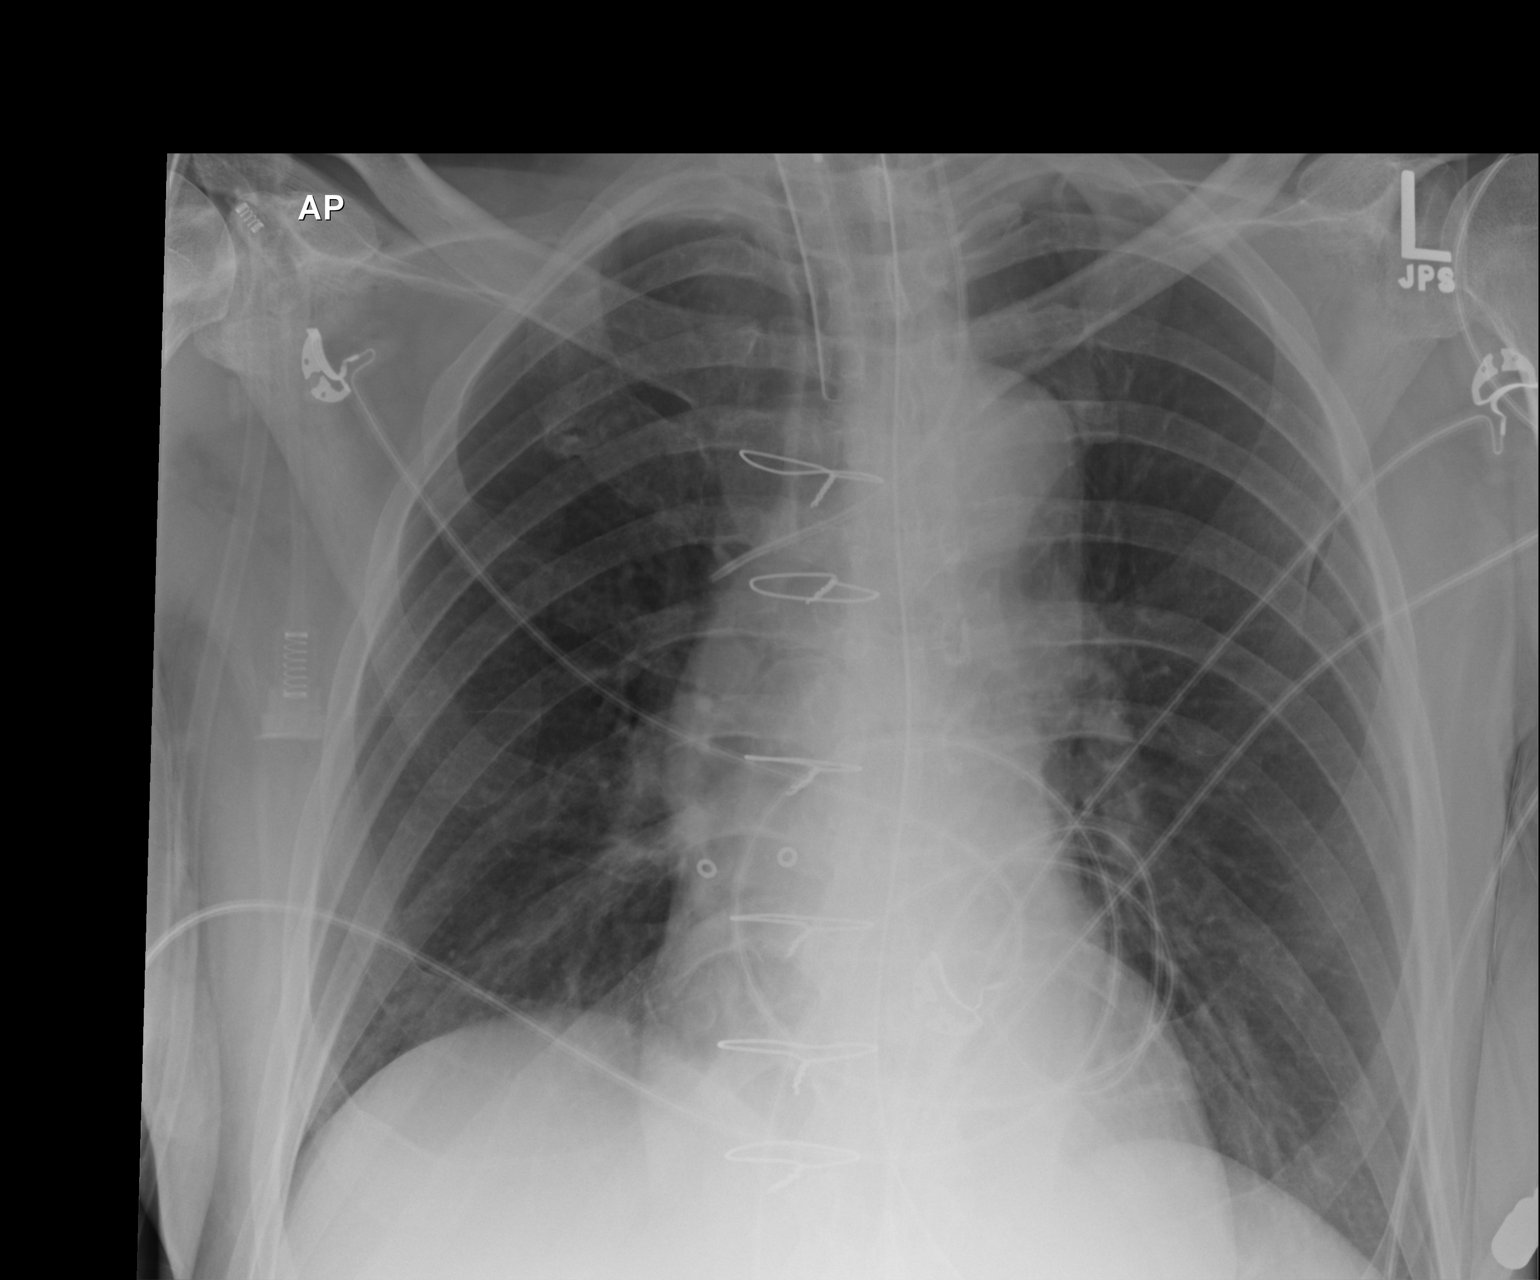

[1 of 1 positions shown; findings below may reference images not displayed]

FINDINGS: Endotracheal tube, left IJ tube, NG tube in good anatomic position.
Mediastinum and hilar structures normal. Heart size normal. No
pleural effusion or pneumothorax. No acute bony abnormality.
IMPRESSION: 1. Good line and tube positions.
2. No acute cardiopulmonary disease.

## 2014-07-29 IMAGING — CR DG ABD PORTABLE 1V
1 series · 1 of 1 positions shown · non-contrast
Comparison: None.

CLINICAL DATA: Orogastric tube placement

EXAM:
PORTABLE ABDOMEN - 1 VIEW

[AP]
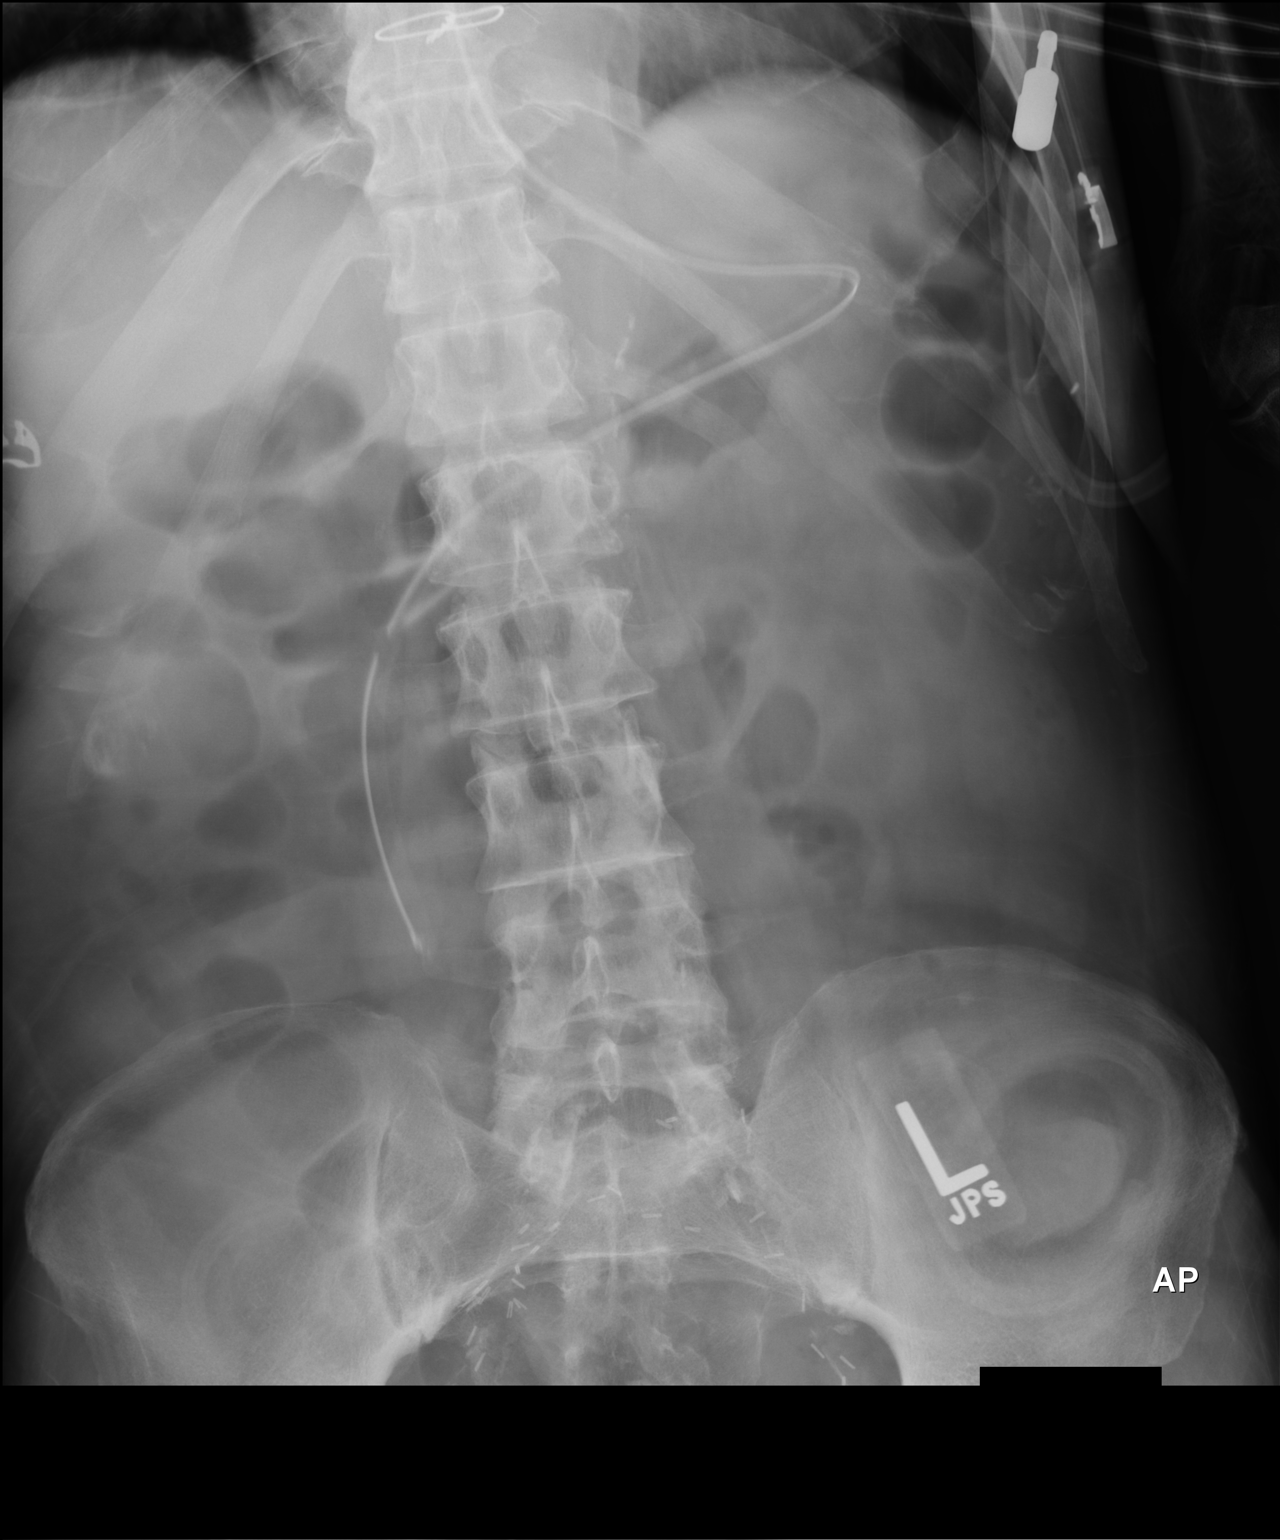

[1 of 1 positions shown; findings below may reference images not displayed]

FINDINGS: OG tube crosses the gastroesophageal junction and extends inferiorly
with its tip in the right mid abdomen. Based upon course and
position,location of tip is most consistent with descending
duodenum. There is note of a left lower quadrant ostomy. There is a
nonobstructive gas pattern.
IMPRESSION: Orogastric tube as described above with tip projecting over the
anticipated position of the descending duodenum.

## 2014-07-31 IMAGING — CR DG CHEST 1V PORT
1 series · 1 of 1 positions shown · non-contrast
Comparison: 10/13/2013

CLINICAL DATA: Assess edema.

EXAM:
PORTABLE CHEST - 1 VIEW

[AP]
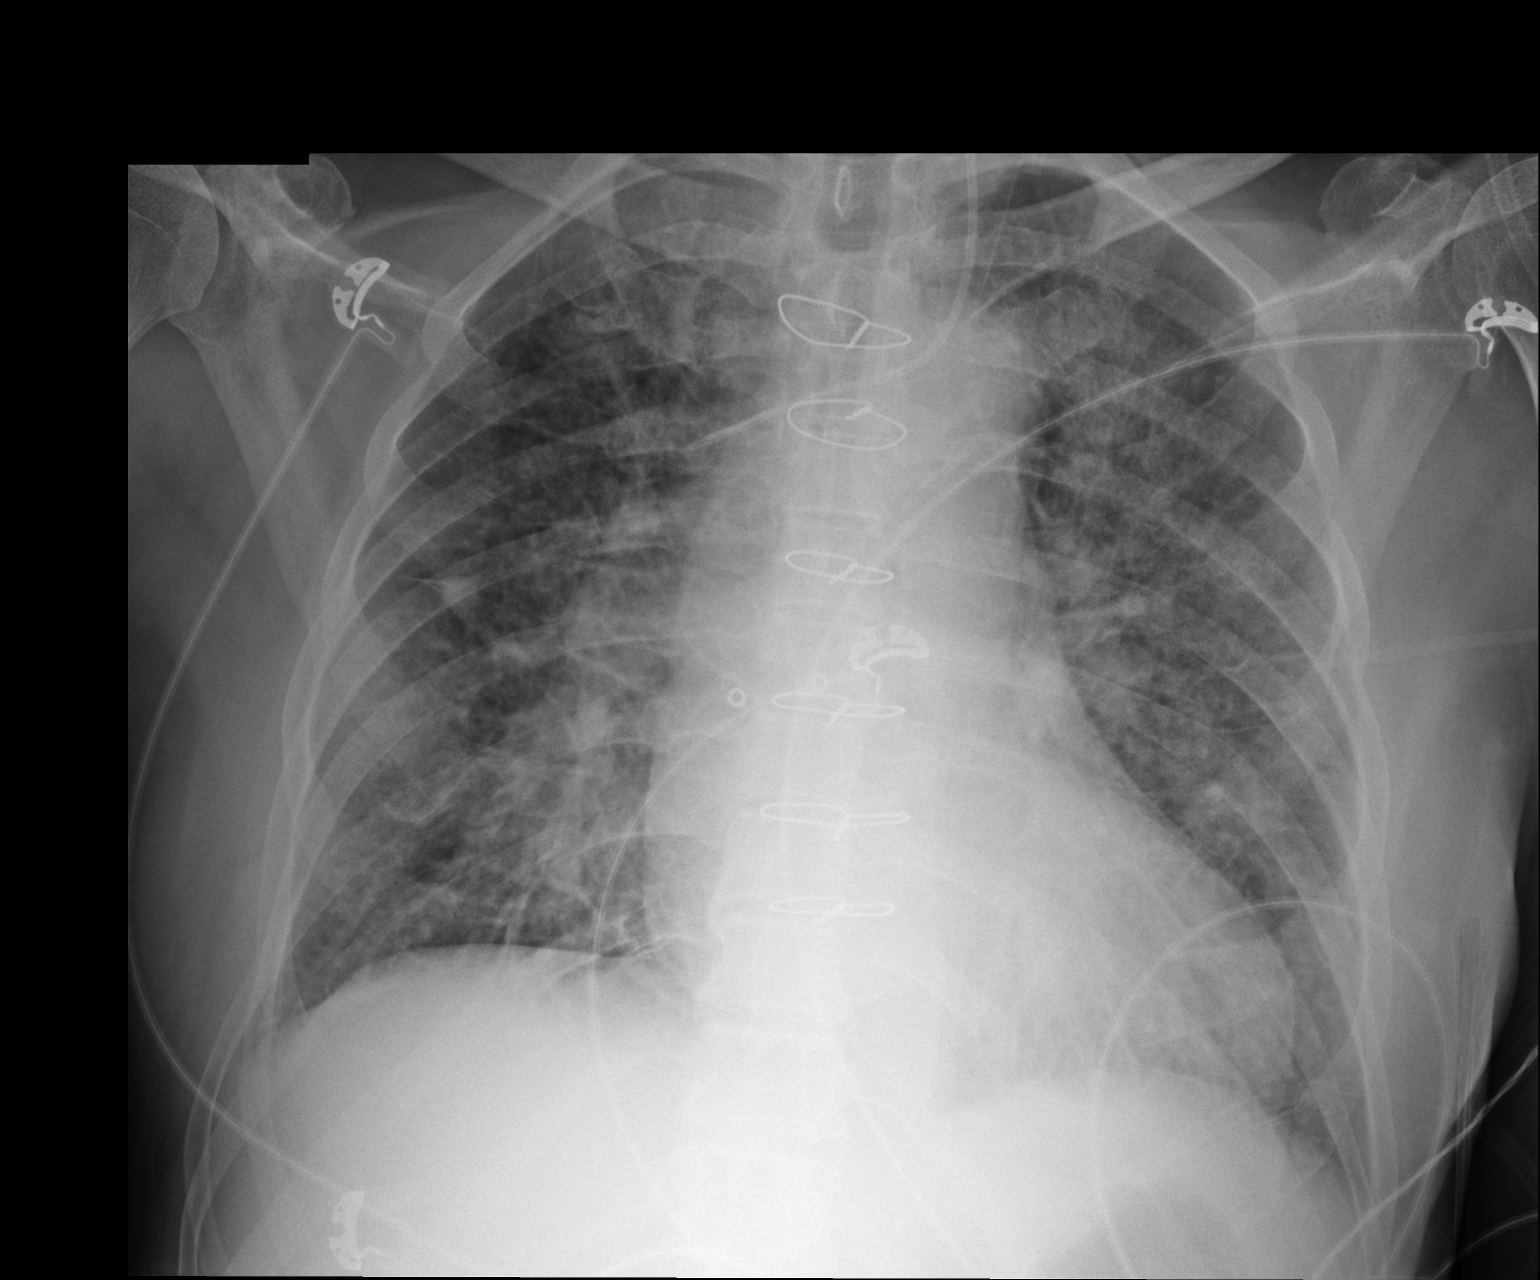

[1 of 1 positions shown; findings below may reference images not displayed]

FINDINGS: Sequelae of prior CABG are again identified. Cardiac silhouette is
mildly enlarged. Endotracheal and enteric tubes have been removed.
Left jugular central venous catheter remains in place with tip
overlying the upper SVC. Pulmonary vasculature is indistinct and
there are increased patchy perihilar opacities. No definite pleural
effusion or pneumothorax is identified.
IMPRESSION: 1. Interval extubation.
2. Worsening pulmonary edema.

## 2014-07-31 IMAGING — CR DG ABD PORTABLE 1V
1 series · 1 of 1 positions shown · non-contrast
Comparison: 10/12/2013

CLINICAL DATA: NG tube placement.

EXAM:
PORTABLE ABDOMEN - 1 VIEW

[AP]
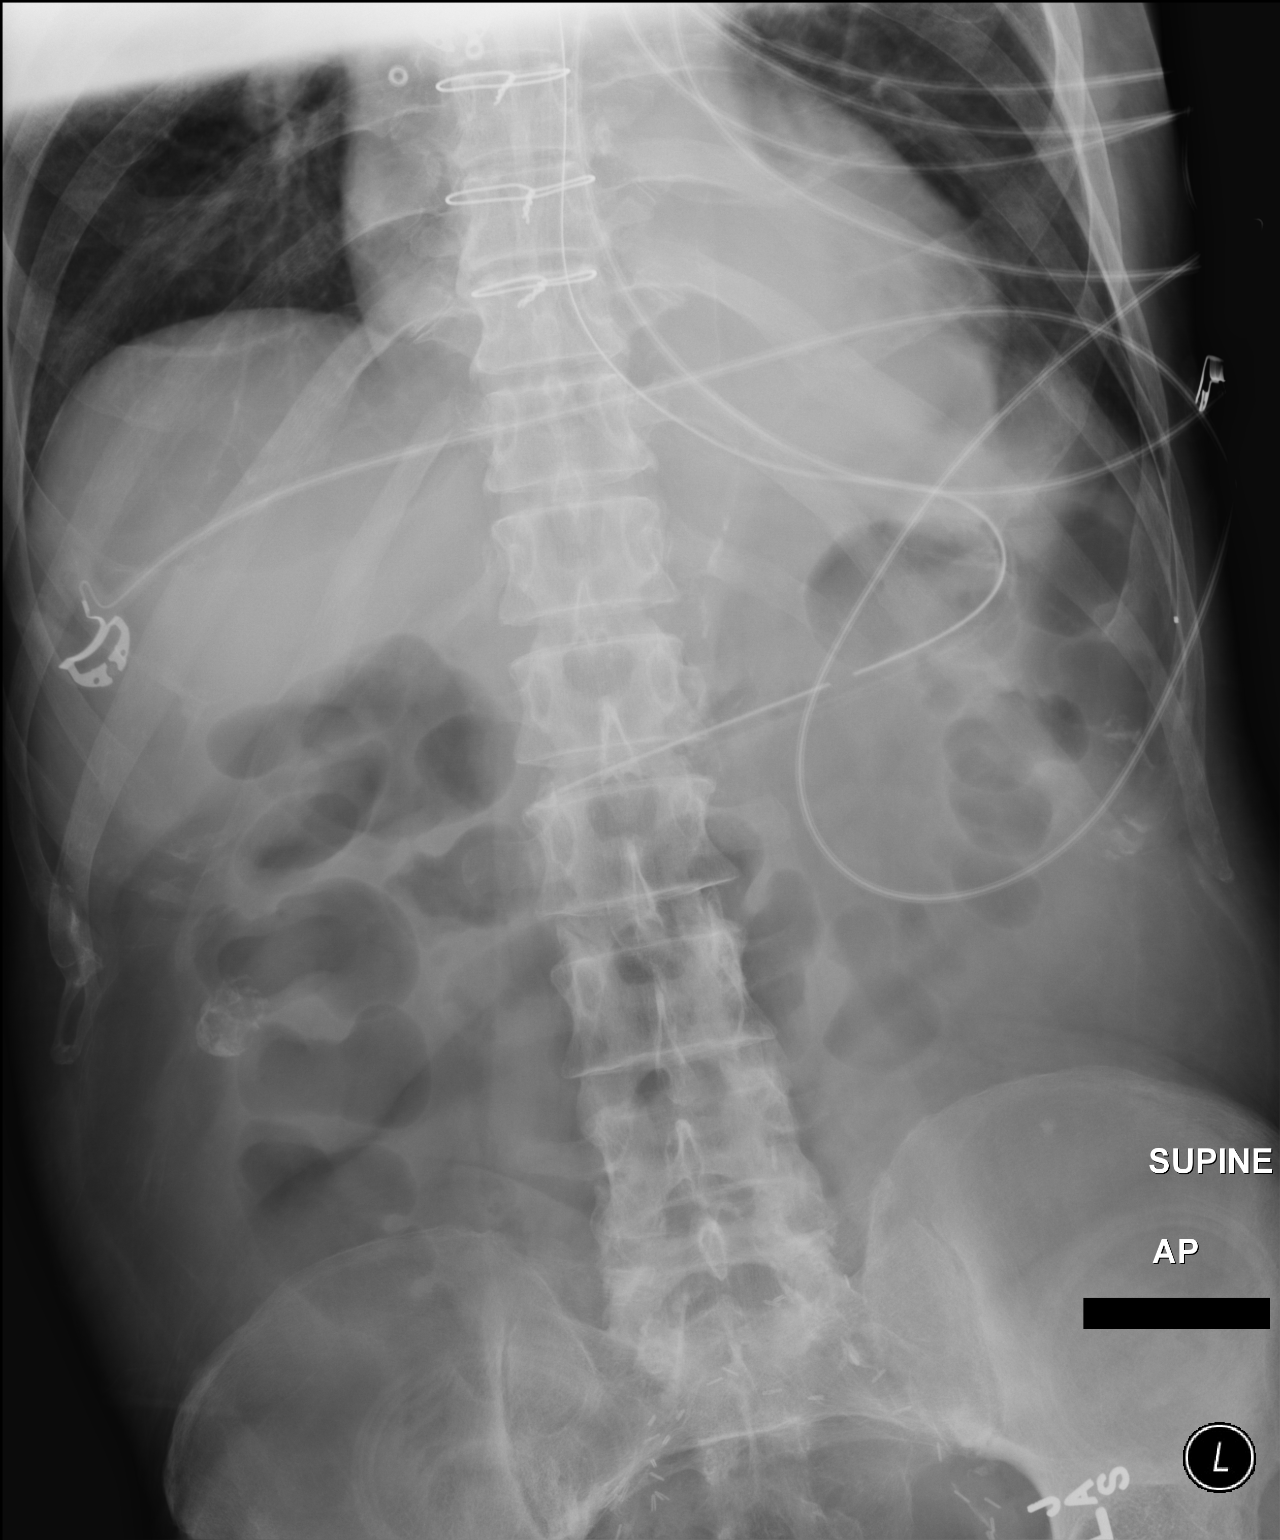

[1 of 1 positions shown; findings below may reference images not displayed]

FINDINGS: Enteric tube is present with side hole overlying the gastric body
and tip in the region of the distal stomach. Gas is present in
nondilated loops of bowel. Calcification /residual oral contrast
material in the right mid abdomen is unchanged. Surgical clips are
present in the upper abdomen and pelvis. No acute osseous
abnormality is identified. Left lower quadrant ostomy is noted.
IMPRESSION: Enteric tube as above with tip in the region of the distal stomach.
